# Patient Record
Sex: Female | Born: 1935 | Race: White | Hispanic: No | Smoking: Never smoker
Health system: Southern US, Community
[De-identification: ages and names within clinical notes are randomized; demographics above are authoritative.]

## PROBLEM LIST (undated history)

## (undated) DIAGNOSIS — M6740 Ganglion, unspecified site: Secondary | ICD-10-CM

## (undated) DIAGNOSIS — M1990 Unspecified osteoarthritis, unspecified site: Secondary | ICD-10-CM

## (undated) DIAGNOSIS — C801 Malignant (primary) neoplasm, unspecified: Secondary | ICD-10-CM

## (undated) DIAGNOSIS — I10 Essential (primary) hypertension: Secondary | ICD-10-CM

## (undated) HISTORY — PX: ABDOMINAL HYSTERECTOMY: SUR658

## (undated) HISTORY — DX: Unspecified osteoarthritis, unspecified site: M19.90

## (undated) HISTORY — PX: SKIN CANCER EXCISION: SHX779

## (undated) HISTORY — PX: BREAST BIOPSY: SHX20

## (undated) HISTORY — DX: Essential (primary) hypertension: I10

## (undated) HISTORY — PX: APPENDECTOMY: SHX54

## (undated) HISTORY — DX: Malignant (primary) neoplasm, unspecified: C80.1

---

## 2005-04-26 ENCOUNTER — Ambulatory Visit: Payer: Self-pay | Admitting: General Surgery

## 2005-09-12 ENCOUNTER — Ambulatory Visit: Payer: Self-pay

## 2006-04-29 ENCOUNTER — Ambulatory Visit: Payer: Self-pay | Admitting: General Surgery

## 2006-10-10 ENCOUNTER — Ambulatory Visit: Payer: Self-pay | Admitting: Gastroenterology

## 2007-03-26 ENCOUNTER — Ambulatory Visit: Payer: Self-pay | Admitting: Ophthalmology

## 2007-04-02 ENCOUNTER — Ambulatory Visit: Payer: Self-pay | Admitting: Ophthalmology

## 2007-05-02 ENCOUNTER — Ambulatory Visit: Payer: Self-pay | Admitting: General Surgery

## 2008-05-03 ENCOUNTER — Ambulatory Visit: Payer: Self-pay | Admitting: General Surgery

## 2009-05-05 ENCOUNTER — Ambulatory Visit: Payer: Self-pay | Admitting: General Surgery

## 2010-05-12 ENCOUNTER — Ambulatory Visit: Payer: Self-pay | Admitting: General Surgery

## 2011-05-23 ENCOUNTER — Ambulatory Visit: Payer: Self-pay | Admitting: Internal Medicine

## 2012-05-28 ENCOUNTER — Ambulatory Visit: Payer: Self-pay | Admitting: Internal Medicine

## 2013-06-02 ENCOUNTER — Ambulatory Visit: Payer: Self-pay | Admitting: Internal Medicine

## 2014-02-19 ENCOUNTER — Ambulatory Visit: Payer: Self-pay | Admitting: Internal Medicine

## 2014-09-29 ENCOUNTER — Ambulatory Visit: Payer: Self-pay | Admitting: Internal Medicine

## 2015-05-23 ENCOUNTER — Ambulatory Visit: Payer: Self-pay

## 2015-06-21 ENCOUNTER — Ambulatory Visit
Admission: RE | Admit: 2015-06-21 | Discharge: 2015-06-21 | Disposition: A | Discharge status: Home / Self Care | Payer: Medicare Other | Source: Ambulatory Visit | Attending: Physician Assistant | Admitting: Physician Assistant

## 2015-06-21 ENCOUNTER — Other Ambulatory Visit: Payer: Self-pay | Admitting: Physician Assistant

## 2015-06-21 DIAGNOSIS — Z90710 Acquired absence of both cervix and uterus: Secondary | ICD-10-CM | POA: Insufficient documentation

## 2015-06-21 DIAGNOSIS — R1031 Right lower quadrant pain: Secondary | ICD-10-CM | POA: Diagnosis not present

## 2015-06-21 DIAGNOSIS — K5900 Constipation, unspecified: Secondary | ICD-10-CM

## 2016-11-13 ENCOUNTER — Other Ambulatory Visit: Payer: Self-pay | Admitting: Internal Medicine

## 2016-11-13 DIAGNOSIS — R634 Abnormal weight loss: Secondary | ICD-10-CM

## 2016-11-13 DIAGNOSIS — R630 Anorexia: Secondary | ICD-10-CM

## 2016-11-15 ENCOUNTER — Ambulatory Visit
Admission: RE | Admit: 2016-11-15 | Discharge: 2016-11-15 | Disposition: A | Discharge status: Home / Self Care | Payer: Medicare Other | Source: Ambulatory Visit | Attending: Internal Medicine | Admitting: Internal Medicine

## 2016-11-15 DIAGNOSIS — R630 Anorexia: Secondary | ICD-10-CM | POA: Insufficient documentation

## 2016-11-15 DIAGNOSIS — Z90710 Acquired absence of both cervix and uterus: Secondary | ICD-10-CM | POA: Insufficient documentation

## 2016-11-15 DIAGNOSIS — R634 Abnormal weight loss: Secondary | ICD-10-CM | POA: Diagnosis present

## 2016-11-15 DIAGNOSIS — K5730 Diverticulosis of large intestine without perforation or abscess without bleeding: Secondary | ICD-10-CM | POA: Diagnosis not present

## 2016-12-11 ENCOUNTER — Other Ambulatory Visit: Payer: Self-pay | Admitting: Nurse Practitioner

## 2016-12-11 ENCOUNTER — Other Ambulatory Visit (HOSPITAL_COMMUNITY): Payer: Self-pay | Admitting: Nurse Practitioner

## 2016-12-11 DIAGNOSIS — R1013 Epigastric pain: Secondary | ICD-10-CM

## 2016-12-11 DIAGNOSIS — R634 Abnormal weight loss: Secondary | ICD-10-CM

## 2016-12-13 ENCOUNTER — Other Ambulatory Visit: Payer: Self-pay | Admitting: Nurse Practitioner

## 2016-12-13 DIAGNOSIS — R1013 Epigastric pain: Secondary | ICD-10-CM

## 2016-12-13 DIAGNOSIS — R634 Abnormal weight loss: Secondary | ICD-10-CM

## 2016-12-14 ENCOUNTER — Ambulatory Visit: Payer: Medicare Other

## 2016-12-27 ENCOUNTER — Other Ambulatory Visit: Payer: Medicare Other

## 2016-12-27 ENCOUNTER — Ambulatory Visit: Payer: Medicare Other

## 2018-10-28 ENCOUNTER — Ambulatory Visit (INDEPENDENT_AMBULATORY_CARE_PROVIDER_SITE_OTHER): Payer: Medicare HMO | Admitting: Orthopaedic Surgery

## 2018-10-28 ENCOUNTER — Ambulatory Visit (INDEPENDENT_AMBULATORY_CARE_PROVIDER_SITE_OTHER): Payer: Medicare HMO

## 2018-10-28 ENCOUNTER — Other Ambulatory Visit (INDEPENDENT_AMBULATORY_CARE_PROVIDER_SITE_OTHER): Payer: Self-pay | Admitting: Radiology

## 2018-10-28 ENCOUNTER — Encounter (INDEPENDENT_AMBULATORY_CARE_PROVIDER_SITE_OTHER): Payer: Self-pay | Admitting: Orthopaedic Surgery

## 2018-10-28 VITALS — BP 165/81 | HR 72 | Resp 18 | Ht 59.5 in | Wt 98.0 lb

## 2018-10-28 DIAGNOSIS — M25552 Pain in left hip: Secondary | ICD-10-CM

## 2018-10-28 DIAGNOSIS — M25561 Pain in right knee: Secondary | ICD-10-CM | POA: Diagnosis not present

## 2018-10-28 DIAGNOSIS — M25551 Pain in right hip: Secondary | ICD-10-CM | POA: Diagnosis not present

## 2018-10-28 DIAGNOSIS — M5442 Lumbago with sciatica, left side: Secondary | ICD-10-CM | POA: Diagnosis not present

## 2018-10-28 DIAGNOSIS — M5441 Lumbago with sciatica, right side: Secondary | ICD-10-CM

## 2018-10-28 DIAGNOSIS — G8929 Other chronic pain: Secondary | ICD-10-CM

## 2018-10-28 DIAGNOSIS — M545 Low back pain: Principal | ICD-10-CM

## 2018-10-28 NOTE — Progress Notes (Signed)
Office Visit Note   Patient: Mary Lara           Date of Birth: 1923/05/19           MRN: [id number] Visit Date: 10/28/2018              Requested by: Tracie Harrier, MD [address] [facility] [place], [place] [address] PCP: Tracie Harrier, MD   Assessment & Plan: Visit Diagnoses:  1. Bilateral hip pain   2. Chronic pain of right knee   3. Chronic bilateral low back pain with bilateral sciatica     Plan: Multiple potential etiologies for bilateral lower extremity pain.  There is mild osteoarthritis of her right hip.  Significant collapse of the left femoral head with secondary osteoarthritis causing  groin pain.  Having claudication that I suspect is related to her lumbar spine.  Long discussion with family over 45 minutes 50% in counseling regarding all of the above.  The obvious problem is the left hip.  I do believe that some of her pain is related to her back and will obtain an MRI scan.  She may be a candidate for epidural steroid injections.  might be a candidate for total hip replacement but I would like to explore other possible pain etiologies before suggesting that  Follow-Up Instructions: Return after MRI L-S spine.   Orders:  Orders Placed This Encounter  Procedures  . XR HIPS BILAT W OR W/O PELVIS 3-4 VIEWS  . XR Lumbar Spine 2-3 Views  . XR KNEE 3 VIEW RIGHT   No orders of the defined types were placed in this encounter.     Procedures: No procedures performed   Clinical Data: No additional findings.   Subjective: Chief Complaint  Patient presents with  . Left Hip - Pain  . Hip Pain    Total body pain, left hip pain x 2 years, worsening, no injury, no left hip surgery, Tylenol, IBU, Tramadol helps very little. difficulty walking, discuss hip replacement, bil knee swelling  Mary Lara is 84 years old accompanied by her son and daughter for evaluation of left hip lower extremity pain.  Mary Lara has been followed medically in  [facility] with evidence of progressive collapse of the left femoral head.  She has had several cortisone injections in her hip the last of which was of little if any benefit.  Walks with a walker.  She lives by herself and would like to remain independent.  She does have some back pain and bilateral lower extremity pain.  The longer she stands or walks the more discomfort she experiences in both of her thighs and legs. after sitting for period of time she feels better.  She does experience bilateral knee pain right greater than left.  She is been taking tramadol, ibuprofen and Tylenol for pain  HPI  Review of Systems  Constitutional: Positive for fatigue.  HENT: Negative for trouble swallowing.   Eyes: Positive for pain.  Respiratory: Positive for shortness of breath.   Cardiovascular: Positive for leg swelling.  Gastrointestinal: Negative for constipation.  Endocrine: Positive for cold intolerance.  Genitourinary: Negative for difficulty urinating.  Musculoskeletal: Positive for gait problem.  Skin: Negative for rash.  Allergic/Immunologic: Negative for food allergies.  Neurological: Positive for numbness.  Hematological: Does not bruise/bleed easily.  Psychiatric/Behavioral: Negative for sleep disturbance.     Objective: Vital Signs: BP (!) 165/81   Pulse 72   Resp 18   Ht 4' 11.5" (  1.511 m)   Wt 98 lb (44.5 kg)   BMI 19.46 kg/m   Physical Exam  Constitutional: She is oriented to person, place, and time. She appears well-developed and well-nourished.  HENT:  Mouth/Throat: Oropharynx is clear and moist.  Eyes: Pupils are equal, round, and reactive to light. EOM are normal.  Pulmonary/Chest: Effort normal.  Neurological: She is alert and oriented to person, place, and time.  Skin: Skin is warm and dry.  Psychiatric: She has a normal mood and affect. Her behavior is normal.    Ortho Exam awake alert and oriented x3.  Comfortable sitting.  Left leg is shorter than her right  based on collapse of the left femoral head.  She does have internal/external rotation of about 20 degrees with some pain on extreme.  Does have some mild loss of motion of her right hip with internal/external rotation but with minimal discomfort.  Increased valgus right knee with small amount of effusion.  Pain predominantly laterally but also has some patellofemoral and medial pain.  Minimal edema both of her ankles.  I did not feel pulses but the feet were warm good capillary refill.  No percussible tenderness of the lumbar spine.  Straight leg raise negative.  Specialty Comments:  No specialty comments available.  Imaging: Xr Hips Bilat W Or W/o Pelvis 3-4 Views  Result Date: 10/28/2018 AP the pelvis demonstrates some narrowing of the joint space right hip.  There are areas of subchondral sclerosis consistent with arthritis.  There is considerable collapse of the left femoral head consistent with avascular necrosis and secondary arthritis  Xr Knee 3 View Right  Result Date: 10/28/2018 Films of the right knee were obtained in 3 projections standing.  There is bone-on-bone in the lateral compartment with about 7 degrees of valgus.  No acute change.  Arthritic changes in all 3 compartments but predominantly laterally where there is bone-on-bone  Xr Lumbar Spine 2-3 Views  Result Date: 10/28/2018 The lumbar spine revealed considerable degenerative disc disease at L2-3, L3-4, L4-5 and L5-S1.  No listhesis.  Facet sclerosis is prominent age of the above levels.  Diffuse calcification of the abdominal aorta without obvious aneurysmal dilatation.  Right-sided degenerative scoliosis of about 7 to 8 degrees    PMFS History: There are no active problems to display for this patient.  Past Medical History:  Diagnosis Date  . Cancer ([place])   . Hypertension   . Osteoarthritis     History reviewed. No pertinent family history.  Past Surgical History:  Procedure Laterality Date  . ABDOMINAL  HYSTERECTOMY    . APPENDECTOMY    . BREAST BIOPSY    . SKIN CANCER EXCISION     Social History   Occupational History  . Not on file  Tobacco Use  . Smoking status: Never Smoker  . Smokeless tobacco: Never Used  Substance and Sexual Activity  . Alcohol use: Never    Frequency: Never  . Drug use: Never  . Sexual activity: Not Currently

## 2018-11-03 ENCOUNTER — Ambulatory Visit (INDEPENDENT_AMBULATORY_CARE_PROVIDER_SITE_OTHER): Payer: Self-pay | Admitting: Orthopaedic Surgery

## 2018-11-05 ENCOUNTER — Ambulatory Visit: Payer: Medicare Other

## 2018-11-09 ENCOUNTER — Ambulatory Visit: Admission: RE | Admit: 2018-11-09 | Payer: Medicare HMO | Source: Ambulatory Visit

## 2018-11-10 ENCOUNTER — Telehealth (INDEPENDENT_AMBULATORY_CARE_PROVIDER_SITE_OTHER): Payer: Self-pay | Admitting: Orthopaedic Surgery

## 2018-11-10 ENCOUNTER — Other Ambulatory Visit (INDEPENDENT_AMBULATORY_CARE_PROVIDER_SITE_OTHER): Payer: Self-pay | Admitting: Orthopaedic Surgery

## 2018-11-10 ENCOUNTER — Ambulatory Visit
Admission: RE | Admit: 2018-11-10 | Discharge: 2018-11-10 | Disposition: A | Discharge status: Home / Self Care | Payer: Medicare HMO | Source: Ambulatory Visit | Attending: Orthopaedic Surgery | Admitting: Orthopaedic Surgery

## 2018-11-10 DIAGNOSIS — M545 Low back pain, unspecified: Secondary | ICD-10-CM

## 2018-11-10 DIAGNOSIS — G8929 Other chronic pain: Secondary | ICD-10-CM

## 2018-11-10 NOTE — Telephone Encounter (Signed)
MRI worked in at [facility] at 11:30, patient aware.

## 2018-11-10 NOTE — Telephone Encounter (Signed)
Patient's daughter [name] called stating that her mom was scheduled for an MRI yesterday 11/09/18 which was cancelled due to the "MRI being down."  [name] is requesting that Dr. Durward Fortes send the referral "STAT" so that her mom can have her MRI today.  [name] requested a return call at [phone] ASAP.

## 2018-11-11 ENCOUNTER — Ambulatory Visit (INDEPENDENT_AMBULATORY_CARE_PROVIDER_SITE_OTHER): Payer: Medicare HMO | Admitting: Orthopaedic Surgery

## 2018-11-11 ENCOUNTER — Encounter (INDEPENDENT_AMBULATORY_CARE_PROVIDER_SITE_OTHER): Payer: Self-pay | Admitting: Orthopaedic Surgery

## 2018-11-11 ENCOUNTER — Other Ambulatory Visit: Payer: Medicare HMO

## 2018-11-11 VITALS — BP 143/79 | HR 75 | Ht 59.5 in | Wt 97.0 lb

## 2018-11-11 DIAGNOSIS — G8929 Other chronic pain: Secondary | ICD-10-CM

## 2018-11-11 DIAGNOSIS — M545 Low back pain, unspecified: Secondary | ICD-10-CM

## 2018-11-11 NOTE — Progress Notes (Signed)
Office Visit Note   Patient: Mary Lara           Date of Birth: [birth year]           MRN: [id number] Visit Date: 11/11/2018              Requested by: Tracie Harrier, MD [address] [facility] [place], [place] [address] PCP: Tracie Harrier, MD   Assessment & Plan: Visit Diagnoses:  1. Chronic midline low back pain, unspecified whether sciatica present     Plan: MRI of lumbar spine demonstrates bilateral lateral recess and foraminal narrowing left worse than right at L3-4 that could cause neural compression particularly on the left.  At L4-5 there was lateral recess stenosis right more than left that could cause right-sided neural compression.  Mary Lara does have pain in both lower extremities which in part may be related to her back.  I think epidural steroid injection would be helpful from a both diagnostic and therapeutic standpoint.  Long discussion with [name] and her son [name].  She certainly has evidence of arthritis of her right knee and significant arthritis of her left hip.  She might be a candidate for hip replacement would like to eliminate all the sources of pain before we proceed.  Discussed at length over approximately 30 minutes 50% of the time in counseling.  Follow-Up Instructions: Return in about 3 weeks (around 12/02/2018).   Orders:  Orders Placed This Encounter  Procedures  . Ambulatory referral to Physical Medicine Rehab   No orders of the defined types were placed in this encounter.     Procedures: No procedures performed   Clinical Data: No additional findings.   Subjective: Chief Complaint  Patient presents with  . Right Hip - Follow-up  . Left Hip - Follow-up  MRI scan as above.  HPI  Review of Systems   Objective: Vital Signs: BP (!) 143/79   Pulse 75   Ht 4' 11.5" (1.511 m)   Wt 97 lb (44 kg)   BMI 19.26 kg/m   Physical Exam  Constitutional: She is oriented to person, place, and time. She appears  well-developed and well-nourished.  HENT:  Mouth/Throat: Oropharynx is clear and moist.  Eyes: Pupils are equal, round, and reactive to light. EOM are normal.  Pulmonary/Chest: Effort normal.  Neurological: She is alert and oriented to person, place, and time.  Skin: Skin is warm and dry.  Psychiatric: She has a normal mood and affect. Her behavior is normal.    Ortho Exam hard of hearing.  Awake alert and oriented x3.  Thin.  No change in exam from that performed several weeks ago.  Straight leg raise negative.  Certainly has arthritic changes about the right knee with hypertrophic changes and lack of full extension.  Significant decreased motion of left hip related to the arthritis.  No distal edema.  +1 pulses and good sensibility  Specialty Comments:  No specialty comments available.  Imaging: No results found.   PMFS History: There are no active problems to display for this patient.  Past Medical History:  Diagnosis Date  . Cancer ([place])   . Hypertension   . Osteoarthritis     History reviewed. No pertinent family history.  Past Surgical History:  Procedure Laterality Date  . ABDOMINAL HYSTERECTOMY    . APPENDECTOMY    . BREAST BIOPSY    . SKIN CANCER EXCISION     Social History   Occupational History  .  Not on file  Tobacco Use  . Smoking status: Never Smoker  . Smokeless tobacco: Never Used  Substance and Sexual Activity  . Alcohol use: Never    Frequency: Never  . Drug use: Never  . Sexual activity: Not Currently     Garald Balding, MD   Note - This record has been created using [employer].  Chart creation errors have been sought, but may not always  have been located. Such creation errors do not reflect on  the standard of medical care. ep

## 2018-11-19 ENCOUNTER — Ambulatory Visit: Payer: Medicare Other

## 2018-12-06 ENCOUNTER — Other Ambulatory Visit: Payer: Self-pay

## 2018-12-06 ENCOUNTER — Encounter: Payer: Self-pay | Admitting: Emergency Medicine

## 2018-12-06 ENCOUNTER — Ambulatory Visit
Admission: EM | Admit: 2018-12-06 | Discharge: 2018-12-06 | Disposition: A | Discharge status: Home / Self Care | Payer: Medicare HMO | Attending: Family Medicine | Admitting: Family Medicine

## 2018-12-06 DIAGNOSIS — B349 Viral infection, unspecified: Secondary | ICD-10-CM | POA: Diagnosis not present

## 2018-12-06 LAB — URINALYSIS, COMPLETE (UACMP) WITH MICROSCOPIC
BILIRUBIN URINE: NEGATIVE
GLUCOSE, UA: NEGATIVE mg/dL
Ketones, ur: NEGATIVE mg/dL
Leukocytes, UA: NEGATIVE
NITRITE: NEGATIVE
PH: 6.5 (ref 5.0–8.0)
Protein, ur: NEGATIVE mg/dL
SPECIFIC GRAVITY, URINE: 1.01 (ref 1.005–1.030)

## 2018-12-06 LAB — RAPID INFLUENZA A&B ANTIGENS
Influenza A (ARMC): NEGATIVE
Influenza B (ARMC): NEGATIVE

## 2018-12-06 NOTE — Discharge Instructions (Signed)
Rest. Fluids.  Take care  Dr. Lacinda Axon

## 2018-12-06 NOTE — ED Provider Notes (Signed)
[facility]    CSN: [id number] Arrival date & time: 12/06/18  1229  History   Chief Complaint Chief Complaint  Patient presents with  . Fever  . Fatigue   HPI  84 year old female presents for evaluation of the above.  Patient states that she did not feel well yesterday.  She states that she "felt sick".  She states that she felt "bad all over".  When asked to further describe this she states that she felt weak.  She had one episode of emesis.  No abdominal pain.  Patient states that she feels improved today but still feels fatigued/weak.  Daughter is concerned about urinary tract infection.  She denies any urinary symptoms.  No abdominal pain.  She has joint pain secondary to osteoarthritis.  Per the daughter, she had a low-grade temperature yesterday of 100.3.  She gave Tylenol with improvement.  She is afebrile currently.  No other associated symptoms.  No other complaints.  History reviewed and updated as below.  Past Medical History:  Diagnosis Date  . Cancer ([place])   . Hypertension   . Osteoarthritis    Past Surgical History:  Procedure Laterality Date  . ABDOMINAL HYSTERECTOMY    . APPENDECTOMY    . BREAST BIOPSY    . SKIN CANCER EXCISION      OB History   No obstetric history on file.    Home Medications    Prior to Admission medications   Medication Sig Start Date End Date Taking? Authorizing Provider  acetaminophen (TYLENOL) 325 MG tablet Take by mouth.   Yes [provider]  aspirin EC 81 MG tablet Take by mouth.   Yes [provider]  b complex vitamins tablet Take by mouth.   Yes [provider]  Calcium Carbonate-Vitamin D 600-400 MG-UNIT tablet Take by mouth.   Yes [provider]  Cholecalciferol (VITAMIN D3) 25 MCG (1000 UT) CAPS Take by mouth.   Yes [provider]  docusate sodium (COLACE) 100 MG capsule Take by mouth. 03/13/18  Yes [provider]  furosemide (LASIX) 20 MG tablet  furosemide 20 mg tablet 07/26/18  Yes [provider]  ibuprofen (ADVIL,MOTRIN) 200 MG tablet Take by mouth.   Yes [provider]  metoprolol succinate (TOPROL-XL) 50 MG 24 hr tablet metoprolol succinate ER 50 mg tablet,extended release 24 hr 12/16/17  Yes [provider]  mirtazapine (REMERON) 7.5 MG tablet mirtazapine 7.5 mg tablet 07/08/18  Yes [provider]  Multiple Vitamins-Minerals (CERTAVITE SENIOR/ANTIOXIDANT) TABS Take by mouth.   Yes [provider]  Omega-3 Fatty Acids (FISH OIL OMEGA-3 PO) Fish Oil   Yes [provider]  Omega-3 Fatty Acids (FISH OIL) 1000 MG CAPS Take by mouth.   Yes [provider]  pantoprazole (PROTONIX) 40 MG tablet pantoprazole 40 mg tablet,delayed release 02/12/18  Yes [provider]  Polyethyl Glycol-Propyl Glycol (SYSTANE OP) Apply to eye.   Yes [provider]  simvastatin (ZOCOR) 20 MG tablet simvastatin 20 mg tablet 02/05/18  Yes [provider]  traMADol (ULTRAM) 50 MG tablet tramadol 50 mg tablet 01/07/18  Yes [provider]  VITAMIN D PO Vitamin D   Yes [provider]  Calcium Carbonate-Vitamin D3 (CALCIUM 600+D3) 600-400 MG-UNIT TABS calcium    [provider]  ranitidine (ZANTAC) 150 MG capsule Take by mouth. 04/24/18 04/24/19  [provider]   Social History Social History   Tobacco Use  . Smoking status: Never Smoker  .  Smokeless tobacco: Never Used  Substance Use Topics  . Alcohol use: Never    Frequency: Never  . Drug use: Never     Allergies   Iodine and Sulfa antibiotics   Review of Systems Review of Systems  Constitutional: Positive for fatigue and fever.  Gastrointestinal: Positive for vomiting.  Neurological: Positive for weakness.   Physical Exam Triage Vital Signs ED Triage Vitals  Enc Vitals Group     BP 12/06/18 1250 133/62     Pulse Rate 12/06/18 1250 87     Resp 12/06/18 1250 18     Temp  12/06/18 1250 97.7 F (36.5 C)     Temp Source 12/06/18 1250 Oral     SpO2 12/06/18 1250 99 %     Weight 12/06/18 1247 99 lb (44.9 kg)     Height 12/06/18 1247 5' (1.524 m)     Head Circumference --      Peak Flow --      Pain Score 12/06/18 1246 0     Pain Loc --      Pain Edu? --      Excl. in [place]? --    Updated Vital Signs BP 133/62 (BP Location: Right Arm)   Pulse 87   Temp 97.7 F (36.5 C) (Oral)   Resp 18   Ht 5' (1.524 m)   Wt 44.9 kg   SpO2 99%   BMI 19.33 kg/m   Visual Acuity Right Eye Distance:   Left Eye Distance:   Bilateral Distance:    Right Eye Near:   Left Eye Near:    Bilateral Near:     Physical Exam Vitals signs and nursing note reviewed.  Constitutional:      General: She is not in acute distress.    Appearance: Normal appearance.  HENT:     Head: Normocephalic and atraumatic.     Nose: Nose normal.     Mouth/Throat:     Mouth: Mucous membranes are moist.     Pharynx: Oropharynx is clear.  Eyes:     General: No scleral icterus.    Conjunctiva/sclera: Conjunctivae normal.  Cardiovascular:     Rate and Rhythm: Normal rate and regular rhythm.  Pulmonary:     Effort: Pulmonary effort is normal. No respiratory distress.     Breath sounds: No wheezing, rhonchi or rales.  Abdominal:     General: There is no distension.     Palpations: Abdomen is soft.     Tenderness: There is no abdominal tenderness.  Neurological:     Mental Status: She is alert.  Psychiatric:        Mood and Affect: Mood normal.        Behavior: Behavior normal.    UC Treatments / Results  Labs (all labs ordered are listed, but only abnormal results are displayed) Labs Reviewed  URINALYSIS, COMPLETE (UACMP) WITH MICROSCOPIC - Abnormal; Notable for the following components:      Result Value   Hgb urine dipstick TRACE (*)    Bacteria, UA RARE (*)    All other components within normal limits  RAPID INFLUENZA A&B ANTIGENS ([facility] ONLY)    EKG None  Radiology No  results found.  Procedures Procedures (including critical care time)  Medications Ordered in UC Medications - No data to display  Initial Impression / Assessment and Plan / UC Course  I have reviewed the triage vital signs and the nursing notes.  Pertinent labs & imaging results that were available  during my care of the patient were reviewed by me and considered in my medical decision making (see chart for details).    84 year old female presents with feeling poorly with one episode of emesis.  She is afebrile currently.  Urinalysis negative.  Influenza negative.  This is likely a viral illness.  She is well-appearing at this time.  Supportive care.  Final Clinical Impressions(s) / UC Diagnoses   Final diagnoses:  Viral illness     Discharge Instructions     Rest. Fluids.  Take care  Dr. Lacinda Axon    ED Prescriptions    None     Controlled Substance Prescriptions [place] Controlled Substance Registry consulted? Not Applicable   Coral Spikes, DO 12/06/18 1717

## 2018-12-06 NOTE — ED Triage Notes (Signed)
Patient c/o fever of 100.3 that started yesterday. Vomited x 1 yesterday. Daughter is requesting a urinalysis to rule out UTI. Patient denies urinary symptoms.

## 2018-12-19 ENCOUNTER — Telehealth (INDEPENDENT_AMBULATORY_CARE_PROVIDER_SITE_OTHER): Payer: Self-pay | Admitting: Orthopaedic Surgery

## 2018-12-19 NOTE — Telephone Encounter (Signed)
Daughter wants to speak with Dr. Durward Fortes directly about patients back injection she had, and how she is doing after injection.

## 2018-12-22 ENCOUNTER — Telehealth (INDEPENDENT_AMBULATORY_CARE_PROVIDER_SITE_OTHER): Payer: Self-pay | Admitting: Orthopaedic Surgery

## 2018-12-22 NOTE — Telephone Encounter (Signed)
FYI

## 2018-12-22 NOTE — Telephone Encounter (Signed)
Patient's daughter [name] left a voicemail stating she was returning Dr. Rudene Anda call.

## 2018-12-22 NOTE — Telephone Encounter (Signed)
Do you have a phone number for daughter?

## 2018-12-22 NOTE — Telephone Encounter (Signed)
Called son and daughter and left messages

## 2018-12-23 NOTE — Telephone Encounter (Signed)
   [name], daughter, returning your call [phone]

## 2018-12-23 NOTE — Telephone Encounter (Signed)
called

## 2019-02-02 ENCOUNTER — Telehealth (INDEPENDENT_AMBULATORY_CARE_PROVIDER_SITE_OTHER): Payer: Self-pay | Admitting: Orthopaedic Surgery

## 2019-02-02 NOTE — Telephone Encounter (Signed)
Patient's daughter [name] left a voicemail requesting a return call regarding her mother's situation with her pain and results of the injections that she had on the 10th at [facility] and what options we have.

## 2019-02-02 NOTE — Telephone Encounter (Signed)
Spoke with patient's daughter [name].  She said that her mother had two lower back injections at L3-4 and L4-5 on the 10th. She has not had any relief from them. Her daughter is concerned for her quality of life due to her back pain. She wants to speak with you about any other options at this point. She has stopped taking the Tramadol and is now taking hydrocodone BID, but they have not noticed any improvement with that either. Her daughter [name] gets off work at [employer] today and would like a call afterwards to discuss.  Thanks! Ph [phone]

## 2019-02-02 NOTE — Telephone Encounter (Signed)
Called..We had a prolonged discussion about these complex clinical issues and went over the various important aspects to consider. All questions were answered.

## 2019-02-18 ENCOUNTER — Encounter (INDEPENDENT_AMBULATORY_CARE_PROVIDER_SITE_OTHER): Payer: Self-pay | Admitting: Orthopaedic Surgery

## 2019-02-18 ENCOUNTER — Ambulatory Visit (INDEPENDENT_AMBULATORY_CARE_PROVIDER_SITE_OTHER): Payer: Medicare HMO | Admitting: Orthopaedic Surgery

## 2019-02-18 ENCOUNTER — Other Ambulatory Visit: Payer: Self-pay

## 2019-02-18 DIAGNOSIS — M1612 Unilateral primary osteoarthritis, left hip: Secondary | ICD-10-CM

## 2019-02-18 NOTE — Progress Notes (Signed)
Office Visit Note   Patient: Mary Lara           Date of Birth: 03/09/1923           MRN: [id number] Visit Date: 02/18/2019              Requested by: Tracie Harrier, MD [address] [facility] [place], [place] [address] PCP: Tracie Harrier, MD   Assessment & Plan: Visit Diagnoses:  1. Unilateral primary osteoarthritis, left hip     Plan: Given the severity of her pain combined with her clinical exam findings and how bad her left hip x-rays look, I do feel it is appropriate to proceed with a total hip arthroplasty through direct anterior approach.  I talked in great detail about the risk and benefits of the surgery.  I showed her and her family a hip model and explained in detail what the surgery involves.  We talked about the intraoperative and postoperative course and what to expect specially with her recovery.  All question concerns were answered and addressed.  We will work on getting this scheduled in the near future.  Follow-Up Instructions: Return for 2 weeks post-op.   Orders:  No orders of the defined types were placed in this encounter.  No orders of the defined types were placed in this encounter.     Procedures: No procedures performed   Clinical Data: No additional findings.   Subjective: Chief Complaint  Patient presents with  . Hip Pain    bilateral hip pain  The patient is a very pleasant 85 year old female who comes in today with her family as a recommendation and referral from my partner Dr. Durward Fortes to consider a direct anterior total hip arthroplasty on her left hip.  She has severe debilitating left hip pain and known and well-documented osteoarthritis with some osteonecrosis of the left femoral head.  At this point her pain is so significant that it is detrimentally affecting her mobility, her quality of life and her activities day living.  She does have some lumbar spine issues and known arthritis in both knees but is the hip  that is most debilitating to her.  The pain is now 10 out of 10.  She has tried failed all forms of conservative treatment.  Her family is with her and attest to this.  She is otherwise healthy in terms of stable medical conditions.  She has no active problems right now and does take medications never reviewed all of these.  At this point she and her family wants to consider total hip arthroplasty surgery given her significant fall risk at this point and given the fact that she is trying to stay as independent as possible but still having severe difficulty with pain and mobility.  HPI  Review of Systems She currently denies any headache, chest pain, shortness of breath, fever, chills, nausea, vomiting  Objective: Vital Signs: There were no vitals taken for this visit.  Physical Exam She is alert and orient x3 and in no acute distress Ortho Exam Examination of her left hip shows almost essentially no internal or external rotation and you can actually feel the crepitation of the bone from the bone rubbing on bone.  Is absolutely awful in terms of the mobility and pain that she is experiencing.  Her right hip moves fluidly. Specialty Comments:  No specialty comments available.  Imaging: No results found. X-rays independently reviewed of the pelvis and hips shows severe end-stage arthritis of  the left hip with likely osteonecrosis as well.  The femoral head is collapsing as well.  There is essentially no joint space remaining.  PMFS History: Patient Active Problem List   Diagnosis Date Noted  . Unilateral primary osteoarthritis, left hip 02/18/2019   Past Medical History:  Diagnosis Date  . Cancer ([place])   . Hypertension   . Osteoarthritis     History reviewed. No pertinent family history.  Past Surgical History:  Procedure Laterality Date  . ABDOMINAL HYSTERECTOMY    . APPENDECTOMY    . BREAST BIOPSY    . SKIN CANCER EXCISION     Social History   Occupational History  . Not on  file  Tobacco Use  . Smoking status: Never Smoker  . Smokeless tobacco: Never Used  Substance and Sexual Activity  . Alcohol use: Never    Frequency: Never  . Drug use: Never  . Sexual activity: Not Currently

## 2019-03-30 ENCOUNTER — Inpatient Hospital Stay (INDEPENDENT_AMBULATORY_CARE_PROVIDER_SITE_OTHER): Payer: Medicare HMO | Admitting: Orthopaedic Surgery

## 2019-06-01 ENCOUNTER — Telehealth: Payer: Self-pay

## 2019-06-01 NOTE — Telephone Encounter (Signed)
Daughter, Joice Lofts, would like to speak with you about her mother.  She needs surgery but they are not wanting to proceed if patient cannot have visitors.  She states [facility] is now allowing visitors.  Please call [name] to further discuss.  [phone]

## 2019-07-15 ENCOUNTER — Other Ambulatory Visit: Payer: Self-pay

## 2019-07-16 ENCOUNTER — Other Ambulatory Visit: Payer: Self-pay | Admitting: Physician Assistant

## 2019-07-27 NOTE — Patient Instructions (Addendum)
YOU NEED TO HAVE A COVID 19 TEST ON_______ @_______ , THIS TEST MUST BE DONE BEFORE SURGERY, COME  [place] , [id number]. ONCE YOUR COVID TEST IS COMPLETED, PLEASE BEGIN THE QUARANTINE INSTRUCTIONS AS OUTLINED IN YOUR HANDOUT.                Mary Lara    Your procedure is scheduled on: 07-31-2019  Report to [facility] Main  Entrance  Report to admitting at 940 AM   1 [place]. NO VISITORS ARE ALLOWED IN SHORT STAY OR RECOVERY ROOM.   Call this number if you have problems the morning of surgery [phone]    Remember: [place], NO CHEWING GUM CANDY OR MINTS.   NO SOLID FOOD AFTER MIDNIGHT THE NIGHT PRIOR TO SURGERY. NOTHING BY MOUTH EXCEPT CLEAR LIQUIDS UNTIL  910 AM . PLEASE FINISH ENSURE DRINK PER SURGEON ORDER  WHICH NEEDS TO BE COMPLETED AT 940 AM  .   CLEAR LIQUID DIET   Foods Allowed                                                                     Foods Excluded  Coffee and tea, regular and decaf                             liquids that you cannot  Plain Jell-O any favor except red or purple                                           see through such as: Fruit ices (not with fruit pulp)                                     milk, soups, orange juice  Iced Popsicles                                    All solid food Carbonated beverages, regular and diet                                    Cranberry, grape and apple juices Sports drinks like Gatorade Lightly seasoned clear broth or consume(fat free) Sugar, honey syrup  Sample Menu Breakfast                                Lunch                                     Supper Cranberry juice  Beef broth                            Chicken broth Jell-O                                     Grape juice                           Apple juice Coffee or tea                         Jell-O                                      Popsicle                                                Coffee or tea                        Coffee or tea  _____________________________________________________________________     Take these medicines the morning of surgery with A SIP OF WATER: mortazaoien (remeron), simvastatin (zocor), metoprolol succinate, eye drop, pantapraole (protonix)                               You may not have any metal on your body including hair pins and              piercings  Do not wear jewelry, make-up, lotions, powders or perfumes, deodorant             Do not wear nail polish.  Do not shave  48 hours prior to surgery.                Do not bring valuables to the hospital. [place].  Contacts, dentures or bridgework may not be worn into surgery.  Leave suitcase in the car. After surgery it may be brought to your room.      _____________________________________________________________________             [facility] - Preparing for Surgery Before surgery, you can play an important role.  Because skin is not sterile, your skin needs to be as free of germs as possible.  You can reduce the number of germs on your skin by washing with CHG (chlorahexidine gluconate) soap before surgery.  CHG is an antiseptic cleaner which kills germs and bonds with the skin to continue killing germs even after washing. Please DO NOT use if you have an allergy to CHG or antibacterial soaps.  If your skin becomes reddened/irritated stop using the CHG and inform your nurse when you arrive at Short Stay. Do not shave (including legs and underarms) for at least 48 hours prior to the first CHG shower.  You may shave your face/neck. Please follow these instructions carefully:  1.  Shower with CHG Soap the night before surgery and the  morning  of Surgery.  2.  If you choose to wash your hair, wash your hair first as usual with your   normal  shampoo.  3.  After you shampoo, rinse your hair and body thoroughly to remove the  shampoo.                           4.  Use CHG as you would any other liquid soap.  You can apply chg directly  to the skin and wash                       Gently with a scrungie or clean washcloth.  5.  Apply the CHG Soap to your body ONLY FROM THE NECK DOWN.   Do not use on face/ open                           Wound or open sores. Avoid contact with eyes, ears mouth and genitals (private parts).                       Wash face,  Genitals (private parts) with your normal soap.             6.  Wash thoroughly, paying special attention to the area where your surgery  will be performed.  7.  Thoroughly rinse your body with warm water from the neck down.  8.  DO NOT shower/wash with your normal soap after using and rinsing off  the CHG Soap.                9.  Pat yourself dry with a clean towel.            10.  Wear clean pajamas.            11.  Place clean sheets on your bed the night of your first shower and do not  sleep with pets. Day of Surgery : Do not apply any lotions/deodorants the morning of surgery.  Please wear clean clothes to the hospital/surgery center.  FAILURE TO FOLLOW THESE INSTRUCTIONS MAY RESULT IN THE CANCELLATION OF YOUR SURGERY PATIENT SIGNATURE_________________________________  NURSE SIGNATURE__________________________________  ________________________________________________________________________   Mary Lara  An incentive spirometer is a tool that can help keep your lungs clear and active. This tool measures how well you are filling your lungs with each breath. Taking long deep breaths may help reverse or decrease the chance of developing breathing (pulmonary) problems (especially infection) following:  A long period of time when you are unable to move or be active. BEFORE THE PROCEDURE   If the spirometer includes an indicator to show your best effort, your  nurse or respiratory therapist will set it to a desired goal.  If possible, sit up straight or lean slightly forward. Try not to slouch.  Hold the incentive spirometer in an upright position. INSTRUCTIONS FOR USE  1. Sit on the edge of your bed if possible, or sit up as far as you can in bed or on a chair. 2. Hold the incentive spirometer in an upright position. 3. Breathe out normally. 4. Place the mouthpiece in your mouth and seal your lips tightly around it. 5. Breathe in slowly and as deeply as possible, raising the piston or the ball toward the top of the column. 6. Hold your breath for 3-5 seconds or for as long as possible.  Allow the piston or ball to fall to the bottom of the column. 7. Remove the mouthpiece from your mouth and breathe out normally. 8. Rest for a few seconds and repeat Steps 1 through 7 at least 10 times every 1-2 hours when you are awake. Take your time and take a few normal breaths between deep breaths. 9. The spirometer may include an indicator to show your best effort. Use the indicator as a goal to work toward during each repetition. 10. After each set of 10 deep breaths, practice coughing to be sure your lungs are clear. If you have an incision (the cut made at the time of surgery), support your incision when coughing by placing a pillow or rolled up towels firmly against it. Once you are able to get out of bed, walk around indoors and cough well. You may stop using the incentive spirometer when instructed by your caregiver.  RISKS AND COMPLICATIONS  Take your time so you do not get dizzy or light-headed.  If you are in pain, you may need to take or ask for pain medication before doing incentive spirometry. It is harder to take a deep breath if you are having pain. AFTER USE  Rest and breathe slowly and easily.  It can be helpful to keep track of a log of your progress. Your caregiver can provide you with a simple table to help with this. If you are using the  spirometer at home, follow these instructions: [place] IF:   You are having difficultly using the spirometer.  You have trouble using the spirometer as often as instructed.  Your pain medication is not giving enough relief while using the spirometer.  You develop fever of 100.5 F (38.1 C) or higher. SEEK IMMEDIATE MEDICAL CARE IF:   You cough up bloody sputum that had not been present before.  You develop fever of 102 F (38.9 C) or greater.  You develop worsening pain at or near the incision site. MAKE SURE YOU:   Understand these instructions.  Will watch your condition.  Will get help right away if you are not doing well or get worse. Document Released: 04/08/2007 Document Revised: 02/18/2012 Document Reviewed: 06/09/2007 Exit[employer] Patient Information 2014 [place].   ________________________________________________________________________   Incentive Spirometer  An incentive spirometer is a tool that can help keep your lungs clear and active. This tool measures how well you are filling your lungs with each breath. Taking long deep breaths may help reverse or decrease the chance of developing breathing (pulmonary) problems (especially infection) following:  A long period of time when you are unable to move or be active. BEFORE THE PROCEDURE   If the spirometer includes an indicator to show your best effort, your nurse or respiratory therapist will set it to a desired goal.  If possible, sit up straight or lean slightly forward. Try not to slouch.  Hold the incentive spirometer in an upright position. INSTRUCTIONS FOR USE  11. Sit on the edge of your bed if possible, or sit up as far as you can in bed or on a chair. 12. Hold the incentive spirometer in an upright position. 13. Breathe out normally. 14. Place the mouthpiece in your mouth and seal your lips tightly around it. 15. Breathe in slowly and as deeply as possible, raising the piston or the  ball toward the top of the column. 16. Hold your breath for 3-5 seconds or for as long as possible. Allow the piston or ball  to fall to the bottom of the column. 17. Remove the mouthpiece from your mouth and breathe out normally. 18. Rest for a few seconds and repeat Steps 1 through 7 at least 10 times every 1-2 hours when you are awake. Take your time and take a few normal breaths between deep breaths. 19. The spirometer may include an indicator to show your best effort. Use the indicator as a goal to work toward during each repetition. 20. After each set of 10 deep breaths, practice coughing to be sure your lungs are clear. If you have an incision (the cut made at the time of surgery), support your incision when coughing by placing a pillow or rolled up towels firmly against it. Once you are able to get out of bed, walk around indoors and cough well. You may stop using the incentive spirometer when instructed by your caregiver.  RISKS AND COMPLICATIONS  Take your time so you do not get dizzy or light-headed.  If you are in pain, you may need to take or ask for pain medication before doing incentive spirometry. It is harder to take a deep breath if you are having pain. AFTER USE  Rest and breathe slowly and easily.  It can be helpful to keep track of a log of your progress. Your caregiver can provide you with a simple table to help with this. If you are using the spirometer at home, follow these instructions: [place] IF:   You are having difficultly using the spirometer.  You have trouble using the spirometer as often as instructed.  Your pain medication is not giving enough relief while using the spirometer.  You develop fever of 100.5 F (38.1 C) or higher. SEEK IMMEDIATE MEDICAL CARE IF:   You cough up bloody sputum that had not been present before.  You develop fever of 102 F (38.9 C) or greater.  You develop worsening pain at or near the incision site. MAKE SURE  YOU:   Understand these instructions.  Will watch your condition.  Will get help right away if you are not doing well or get worse. Document Released: 04/08/2007 Document Revised: 02/18/2012 Document Reviewed: 06/09/2007 [facility] Patient Information 2014 [place], [place].   ________________________________________________________________________

## 2019-07-28 ENCOUNTER — Other Ambulatory Visit (HOSPITAL_COMMUNITY)
Admission: RE | Admit: 2019-07-28 | Discharge: 2019-07-28 | Disposition: A | Discharge status: Home / Self Care | Payer: Medicare HMO | Source: Ambulatory Visit | Attending: Orthopaedic Surgery | Admitting: Orthopaedic Surgery

## 2019-07-28 ENCOUNTER — Other Ambulatory Visit: Payer: Self-pay

## 2019-07-28 ENCOUNTER — Encounter (HOSPITAL_COMMUNITY): Payer: Self-pay

## 2019-07-28 ENCOUNTER — Encounter (HOSPITAL_COMMUNITY)
Admission: RE | Admit: 2019-07-28 | Discharge: 2019-07-28 | Disposition: A | Discharge status: Home / Self Care | Payer: Medicare HMO | Source: Ambulatory Visit | Attending: Orthopaedic Surgery | Admitting: Orthopaedic Surgery

## 2019-07-28 DIAGNOSIS — Z01818 Encounter for other preprocedural examination: Secondary | ICD-10-CM | POA: Insufficient documentation

## 2019-07-28 DIAGNOSIS — Z20828 Contact with and (suspected) exposure to other viral communicable diseases: Secondary | ICD-10-CM | POA: Insufficient documentation

## 2019-07-28 HISTORY — DX: Ganglion, unspecified site: M67.40

## 2019-07-28 LAB — BASIC METABOLIC PANEL
Anion gap: 5 (ref 5–15)
BUN: 26 mg/dL — ABNORMAL HIGH (ref 8–23)
CO2: 31 mmol/L (ref 22–32)
Calcium: 9.8 mg/dL (ref 8.9–10.3)
Chloride: 103 mmol/L (ref 98–111)
Creatinine, Ser: 0.6 mg/dL (ref 0.44–1.00)
GFR calc Af Amer: >60 mL/min (ref >60)
GFR calc non Af Amer: >60 mL/min (ref >60)
Glucose, Bld: 102 mg/dL — ABNORMAL HIGH (ref 70–99)
Potassium: 4.9 mmol/L (ref 3.5–5.1)
Sodium: 139 mmol/L (ref 135–145)

## 2019-07-28 LAB — CBC
HCT: 38.2 % (ref 36.0–46.0)
Hemoglobin: 12.2 g/dL (ref 12.0–15.0)
MCH: 31.5 pg (ref 26.0–34.0)
MCHC: 31.9 g/dL (ref 30.0–36.0)
MCV: 98.7 fL (ref 80.0–100.0)
Platelets: 235 10*3/uL (ref 150–400)
RBC: 3.87 MIL/uL (ref 3.87–5.11)
RDW: 14.5 % (ref 11.5–15.5)
WBC: 7 10*3/uL (ref 4.0–10.5)
nRBC: 0 % (ref 0.0–0.2)

## 2019-07-28 LAB — SURGICAL PCR SCREEN
MRSA, PCR: NEGATIVE
Staphylococcus aureus: NEGATIVE

## 2019-07-28 LAB — SARS CORONAVIRUS 2 (TAT 6-24 HRS): SARS Coronavirus 2: NEGATIVE

## 2019-07-30 ENCOUNTER — Telehealth: Payer: Self-pay | Admitting: Orthopaedic Surgery

## 2019-07-30 NOTE — Telephone Encounter (Signed)
Patient's daughter [name] called asked if her brother [name]) can see his mother after her surgery tomorrow when she goes to recovery. [name] said they would go one at a time to see her.[name] said a new company ([employer]) is coming to her mother's home for home health care over night and will require another Covid-19 test for her mother before she discharge from the hospital.  The number to contact patient is [phone]

## 2019-07-30 NOTE — Telephone Encounter (Signed)
See below

## 2019-07-31 ENCOUNTER — Ambulatory Visit (HOSPITAL_COMMUNITY): Payer: Medicare HMO | Admitting: Anesthesiology

## 2019-07-31 ENCOUNTER — Ambulatory Visit (HOSPITAL_COMMUNITY): Payer: Medicare HMO | Admitting: Physician Assistant

## 2019-07-31 ENCOUNTER — Ambulatory Visit (HOSPITAL_COMMUNITY): Payer: Medicare HMO

## 2019-07-31 ENCOUNTER — Inpatient Hospital Stay (HOSPITAL_COMMUNITY)
Admission: AD | Admit: 2019-07-31 | Discharge: 2019-08-03 | DRG: 470 | Disposition: A | Discharge status: Home Health | Payer: Medicare HMO | Attending: Orthopaedic Surgery | Admitting: Orthopaedic Surgery

## 2019-07-31 ENCOUNTER — Encounter (HOSPITAL_COMMUNITY)
Admission: AD | Disposition: A | Discharge status: Home Health | Payer: Self-pay | Source: Home / Self Care | Attending: Orthopaedic Surgery

## 2019-07-31 ENCOUNTER — Other Ambulatory Visit: Payer: Self-pay

## 2019-07-31 ENCOUNTER — Encounter (HOSPITAL_COMMUNITY): Payer: Self-pay | Admitting: *Deleted

## 2019-07-31 DIAGNOSIS — Z882 Allergy status to sulfonamides status: Secondary | ICD-10-CM

## 2019-07-31 DIAGNOSIS — Z79899 Other long term (current) drug therapy: Secondary | ICD-10-CM

## 2019-07-31 DIAGNOSIS — Z888 Allergy status to other drugs, medicaments and biological substances status: Secondary | ICD-10-CM

## 2019-07-31 DIAGNOSIS — R54 Age-related physical debility: Secondary | ICD-10-CM | POA: Diagnosis present

## 2019-07-31 DIAGNOSIS — Z20828 Contact with and (suspected) exposure to other viral communicable diseases: Secondary | ICD-10-CM | POA: Diagnosis present

## 2019-07-31 DIAGNOSIS — M1612 Unilateral primary osteoarthritis, left hip: Secondary | ICD-10-CM | POA: Diagnosis not present

## 2019-07-31 DIAGNOSIS — Z96642 Presence of left artificial hip joint: Secondary | ICD-10-CM

## 2019-07-31 DIAGNOSIS — N952 Postmenopausal atrophic vaginitis: Secondary | ICD-10-CM | POA: Diagnosis present

## 2019-07-31 DIAGNOSIS — N3592 Unspecified urethral stricture, female: Secondary | ICD-10-CM | POA: Diagnosis present

## 2019-07-31 DIAGNOSIS — D62 Acute posthemorrhagic anemia: Secondary | ICD-10-CM | POA: Diagnosis not present

## 2019-07-31 DIAGNOSIS — Z85828 Personal history of other malignant neoplasm of skin: Secondary | ICD-10-CM

## 2019-07-31 DIAGNOSIS — I10 Essential (primary) hypertension: Secondary | ICD-10-CM | POA: Diagnosis present

## 2019-07-31 DIAGNOSIS — M879 Osteonecrosis, unspecified: Secondary | ICD-10-CM | POA: Diagnosis present

## 2019-07-31 DIAGNOSIS — Z90710 Acquired absence of both cervix and uterus: Secondary | ICD-10-CM

## 2019-07-31 DIAGNOSIS — Z419 Encounter for procedure for purposes other than remedying health state, unspecified: Secondary | ICD-10-CM

## 2019-07-31 DIAGNOSIS — Z791 Long term (current) use of non-steroidal anti-inflammatories (NSAID): Secondary | ICD-10-CM

## 2019-07-31 DIAGNOSIS — Z79891 Long term (current) use of opiate analgesic: Secondary | ICD-10-CM

## 2019-07-31 DIAGNOSIS — Z7982 Long term (current) use of aspirin: Secondary | ICD-10-CM

## 2019-07-31 HISTORY — PX: TOTAL HIP ARTHROPLASTY: SHX124

## 2019-07-31 SURGERY — ARTHROPLASTY, HIP, TOTAL, ANTERIOR APPROACH
Anesthesia: Spinal | Site: Hip | Laterality: Left

## 2019-07-31 MED ORDER — MIRTAZAPINE 7.5 MG PO TABS
3.7500 mg | ORAL_TABLET | Freq: Every day | ORAL | Status: DC
  Administered 2019-07-31 – 2019-08-02 (×3): 3.75 mg via ORAL
  Filled 2019-07-31 (×4): qty 1

## 2019-07-31 MED ORDER — VITAMIN D 25 MCG (1000 UNIT) PO TABS
500.0000 [IU] | ORAL_TABLET | Freq: Two times a day (BID) | ORAL | Status: DC
  Administered 2019-08-01 – 2019-08-03 (×5): 500 [IU] via ORAL
  Filled 2019-07-31 (×6): qty 1

## 2019-07-31 MED ORDER — OXYCODONE HCL 5 MG PO TABS: 5.0000 mg | ORAL_TABLET | Freq: Once | ORAL | Status: DC | PRN

## 2019-07-31 MED ORDER — ASPIRIN 81 MG PO CHEW
81.0000 mg | CHEWABLE_TABLET | Freq: Two times a day (BID) | ORAL | Status: DC
  Administered 2019-07-31 – 2019-08-03 (×6): 81 mg via ORAL
  Filled 2019-07-31 (×6): qty 1

## 2019-07-31 MED ORDER — METHOCARBAMOL 1000 MG/10ML IJ SOLN
500.0000 mg | Freq: Four times a day (QID) | INTRAVENOUS | Status: DC | PRN
  Filled 2019-07-31: qty 5

## 2019-07-31 MED ORDER — MORPHINE SULFATE (PF) 2 MG/ML IV SOLN: 0.5000 mg | INTRAVENOUS | Status: DC | PRN

## 2019-07-31 MED ORDER — SODIUM CHLORIDE 0.9 % IR SOLN
Status: DC | PRN
  Administered 2019-07-31: 1000 mL

## 2019-07-31 MED ORDER — PROPOFOL 500 MG/50ML IV EMUL
INTRAVENOUS | Status: DC | PRN
  Administered 2019-07-31: 50 ug/kg/min via INTRAVENOUS

## 2019-07-31 MED ORDER — B COMPLEX PO TABS: 1.0000 | ORAL_TABLET | Freq: Every day | ORAL | Status: DC

## 2019-07-31 MED ORDER — EPHEDRINE SULFATE 50 MG/ML IJ SOLN
INTRAMUSCULAR | Status: DC | PRN
  Administered 2019-07-31 (×5): 5 mg via INTRAVENOUS

## 2019-07-31 MED ORDER — CLINDAMYCIN PHOSPHATE 600 MG/50ML IV SOLN
600.0000 mg | Freq: Four times a day (QID) | INTRAVENOUS | Status: AC
  Administered 2019-07-31 – 2019-08-01 (×2): 600 mg via INTRAVENOUS
  Filled 2019-07-31 (×2): qty 50

## 2019-07-31 MED ORDER — METOPROLOL SUCCINATE ER 50 MG PO TB24
50.0000 mg | ORAL_TABLET | Freq: Every day | ORAL | Status: DC
  Administered 2019-08-01 – 2019-08-03 (×3): 50 mg via ORAL
  Filled 2019-07-31 (×3): qty 1

## 2019-07-31 MED ORDER — ALUM & MAG HYDROXIDE-SIMETH 200-200-20 MG/5ML PO SUSP: 30.0000 mL | ORAL | Status: DC | PRN

## 2019-07-31 MED ORDER — 0.9 % SODIUM CHLORIDE (POUR BTL) OPTIME
TOPICAL | Status: DC | PRN
  Administered 2019-07-31: 1000 mL

## 2019-07-31 MED ORDER — POLYETHYL GLYCOL-PROPYL GLYCOL 0.4-0.3 % OP GEL: Freq: Three times a day (TID) | OPHTHALMIC | Status: DC

## 2019-07-31 MED ORDER — HYDROCODONE-ACETAMINOPHEN 5-325 MG PO TABS
1.0000 | ORAL_TABLET | ORAL | Status: DC | PRN
  Administered 2019-07-31 – 2019-08-02 (×4): 1 via ORAL
  Filled 2019-07-31 (×4): qty 1

## 2019-07-31 MED ORDER — DIPHENHYDRAMINE HCL 12.5 MG/5ML PO ELIX: 12.5000 mg | ORAL_SOLUTION | ORAL | Status: DC | PRN

## 2019-07-31 MED ORDER — BUPIVACAINE HCL (PF) 0.75 % IJ SOLN
INTRAMUSCULAR | Status: DC | PRN
  Administered 2019-07-31: 1.8 mL via INTRATHECAL

## 2019-07-31 MED ORDER — CLINDAMYCIN PHOSPHATE 900 MG/50ML IV SOLN
900.0000 mg | INTRAVENOUS | Status: AC
  Administered 2019-07-31: 900 mg via INTRAVENOUS
  Filled 2019-07-31: qty 50

## 2019-07-31 MED ORDER — HYDROMORPHONE HCL 1 MG/ML IJ SOLN: 0.2500 mg | INTRAMUSCULAR | Status: DC | PRN

## 2019-07-31 MED ORDER — ADULT MULTIVITAMIN W/MINERALS CH
1.0000 | ORAL_TABLET | Freq: Every day | ORAL | Status: DC
  Administered 2019-08-01 – 2019-08-03 (×3): 1 via ORAL
  Filled 2019-07-31 (×3): qty 1

## 2019-07-31 MED ORDER — CALCIUM CITRATE 950 (200 CA) MG PO TABS
200.0000 mg | ORAL_TABLET | Freq: Two times a day (BID) | ORAL | Status: DC
  Administered 2019-08-02: 200 mg via ORAL
  Filled 2019-07-31 (×7): qty 1

## 2019-07-31 MED ORDER — CHLORHEXIDINE GLUCONATE 4 % EX LIQD: 60.0000 mL | Freq: Once | CUTANEOUS | Status: DC

## 2019-07-31 MED ORDER — GABAPENTIN 100 MG PO CAPS
100.0000 mg | ORAL_CAPSULE | Freq: Three times a day (TID) | ORAL | Status: DC
  Administered 2019-07-31 – 2019-08-03 (×8): 100 mg via ORAL
  Filled 2019-07-31 (×10): qty 1

## 2019-07-31 MED ORDER — TRANEXAMIC ACID-NACL 1000-0.7 MG/100ML-% IV SOLN
1000.0000 mg | INTRAVENOUS | Status: AC
  Administered 2019-07-31: 1000 mg via INTRAVENOUS
  Filled 2019-07-31: qty 100

## 2019-07-31 MED ORDER — POLYVINYL ALCOHOL 1.4 % OP SOLN
1.0000 [drp] | Freq: Three times a day (TID) | OPHTHALMIC | Status: DC
  Administered 2019-07-31 – 2019-08-03 (×8): 1 [drp] via OPHTHALMIC
  Filled 2019-07-31: qty 15

## 2019-07-31 MED ORDER — PHENYLEPHRINE HCL (PRESSORS) 10 MG/ML IV SOLN
INTRAVENOUS | Status: DC | PRN
  Administered 2019-07-31 (×7): 80 ug via INTRAVENOUS

## 2019-07-31 MED ORDER — LIP MEDEX EX OINT
TOPICAL_OINTMENT | CUTANEOUS | Status: AC
  Filled 2019-07-31: qty 7

## 2019-07-31 MED ORDER — CERTAVITE SENIOR/ANTIOXIDANT PO TABS: 1.0000 | ORAL_TABLET | Freq: Every day | ORAL | Status: DC

## 2019-07-31 MED ORDER — FUROSEMIDE 20 MG PO TABS
20.0000 mg | ORAL_TABLET | ORAL | Status: DC
  Administered 2019-08-01: 20 mg via ORAL
  Filled 2019-07-31 (×2): qty 1

## 2019-07-31 MED ORDER — PHENOL 1.4 % MT LIQD
1.0000 | OROMUCOSAL | Status: DC | PRN
  Filled 2019-07-31: qty 177

## 2019-07-31 MED ORDER — PROPOFOL 10 MG/ML IV BOLUS
INTRAVENOUS | Status: AC
  Filled 2019-07-31: qty 40

## 2019-07-31 MED ORDER — SIMVASTATIN 20 MG PO TABS
20.0000 mg | ORAL_TABLET | Freq: Every day | ORAL | Status: DC
  Administered 2019-07-31 – 2019-08-02 (×3): 20 mg via ORAL
  Filled 2019-07-31 (×3): qty 1

## 2019-07-31 MED ORDER — ACETAMINOPHEN 325 MG PO TABS
325.0000 mg | ORAL_TABLET | Freq: Four times a day (QID) | ORAL | Status: DC | PRN
  Administered 2019-08-01 – 2019-08-02 (×2): 650 mg via ORAL
  Filled 2019-07-31 (×2): qty 2

## 2019-07-31 MED ORDER — MENTHOL 3 MG MT LOZG: 1.0000 | LOZENGE | OROMUCOSAL | Status: DC | PRN

## 2019-07-31 MED ORDER — EPHEDRINE 5 MG/ML INJ
INTRAVENOUS | Status: AC
  Filled 2019-07-31: qty 20

## 2019-07-31 MED ORDER — PROPOFOL 500 MG/50ML IV EMUL
INTRAVENOUS | Status: DC | PRN
  Administered 2019-07-31: 20 mg via INTRAVENOUS

## 2019-07-31 MED ORDER — PROMETHAZINE HCL 25 MG/ML IJ SOLN: 6.2500 mg | INTRAMUSCULAR | Status: DC | PRN

## 2019-07-31 MED ORDER — FENTANYL CITRATE (PF) 100 MCG/2ML IJ SOLN
INTRAMUSCULAR | Status: DC | PRN
  Administered 2019-07-31: 50 ug via INTRAVENOUS

## 2019-07-31 MED ORDER — VITAMIN D 25 MCG (1000 UNIT) PO TABS
1000.0000 [IU] | ORAL_TABLET | Freq: Every day | ORAL | Status: DC
  Administered 2019-08-01 – 2019-08-03 (×3): 1000 [IU] via ORAL
  Filled 2019-07-31 (×3): qty 1

## 2019-07-31 MED ORDER — CALCIUM CITRATE-VITAMIN D 315-200 MG-UNIT PO TABS: 1.0000 | ORAL_TABLET | Freq: Two times a day (BID) | ORAL | Status: DC

## 2019-07-31 MED ORDER — STERILE WATER FOR IRRIGATION IR SOLN
Status: DC | PRN
  Administered 2019-07-31: 2000 mL

## 2019-07-31 MED ORDER — HYDROCODONE-ACETAMINOPHEN 7.5-325 MG PO TABS: 1.0000 | ORAL_TABLET | ORAL | Status: DC | PRN

## 2019-07-31 MED ORDER — B COMPLEX-C PO TABS
1.0000 | ORAL_TABLET | Freq: Every day | ORAL | Status: DC
  Administered 2019-08-01 – 2019-08-03 (×3): 1 via ORAL
  Filled 2019-07-31 (×3): qty 1

## 2019-07-31 MED ORDER — ONDANSETRON HCL 4 MG/2ML IJ SOLN: 4.0000 mg | Freq: Four times a day (QID) | INTRAMUSCULAR | Status: DC | PRN

## 2019-07-31 MED ORDER — ONDANSETRON HCL 4 MG PO TABS: 4.0000 mg | ORAL_TABLET | Freq: Four times a day (QID) | ORAL | Status: DC | PRN

## 2019-07-31 MED ORDER — METOCLOPRAMIDE HCL 5 MG PO TABS: 5.0000 mg | ORAL_TABLET | Freq: Three times a day (TID) | ORAL | Status: DC | PRN

## 2019-07-31 MED ORDER — OXYCODONE HCL 5 MG/5ML PO SOLN: 5.0000 mg | Freq: Once | ORAL | Status: DC | PRN

## 2019-07-31 MED ORDER — LACTATED RINGERS IV SOLN
INTRAVENOUS | Status: DC
  Administered 2019-07-31 (×2): via INTRAVENOUS

## 2019-07-31 MED ORDER — SODIUM CHLORIDE 0.9 % IV SOLN
INTRAVENOUS | Status: DC
  Administered 2019-07-31: 19:00:00 via INTRAVENOUS

## 2019-07-31 MED ORDER — METOCLOPRAMIDE HCL 5 MG/ML IJ SOLN: 5.0000 mg | Freq: Three times a day (TID) | INTRAMUSCULAR | Status: DC | PRN

## 2019-07-31 MED ORDER — PANTOPRAZOLE SODIUM 40 MG PO TBEC
40.0000 mg | DELAYED_RELEASE_TABLET | Freq: Every day | ORAL | Status: DC
  Administered 2019-08-01 – 2019-08-03 (×3): 40 mg via ORAL
  Filled 2019-07-31 (×3): qty 1

## 2019-07-31 MED ORDER — DOCUSATE SODIUM 100 MG PO CAPS
100.0000 mg | ORAL_CAPSULE | Freq: Two times a day (BID) | ORAL | Status: DC
  Administered 2019-07-31 – 2019-08-02 (×5): 100 mg via ORAL
  Filled 2019-07-31 (×6): qty 1

## 2019-07-31 MED ORDER — FENTANYL CITRATE (PF) 100 MCG/2ML IJ SOLN
INTRAMUSCULAR | Status: AC
  Filled 2019-07-31: qty 2

## 2019-07-31 MED ORDER — VITAMIN D3 25 MCG (1000 UT) PO CAPS: 1000.0000 [IU] | ORAL_CAPSULE | Freq: Every day | ORAL | Status: DC

## 2019-07-31 MED ORDER — PHENYLEPHRINE 40 MCG/ML (10ML) SYRINGE FOR IV PUSH (FOR BLOOD PRESSURE SUPPORT)
PREFILLED_SYRINGE | INTRAVENOUS | Status: AC
  Filled 2019-07-31: qty 20

## 2019-07-31 MED ORDER — METHOCARBAMOL 500 MG PO TABS
500.0000 mg | ORAL_TABLET | Freq: Four times a day (QID) | ORAL | Status: DC | PRN
  Administered 2019-07-31 – 2019-08-01 (×3): 500 mg via ORAL
  Filled 2019-07-31 (×3): qty 1

## 2019-07-31 SURGICAL SUPPLY — 47 items
BAG URINE DRAINAGE (UROLOGICAL SUPPLIES) ×3 IMPLANT
BAG ZIPLOCK 12X15 (MISCELLANEOUS) IMPLANT
BENZOIN TINCTURE PRP APPL 2/3 (GAUZE/BANDAGES/DRESSINGS) IMPLANT
BLADE SAW SGTL 18X1.27X75 (BLADE) ×2 IMPLANT
BLADE SAW SGTL 18X1.27X75MM (BLADE) ×1
BLADE SURG SZ10 CARB STEEL (BLADE) ×6 IMPLANT
CATH COUDE 5CC RIBBED (CATHETERS) ×1 IMPLANT
CATH RIBBED COUDE 5CC (CATHETERS) ×2
CLOSURE WOUND 1/2 X4 (GAUZE/BANDAGES/DRESSINGS)
COVER PERINEAL POST (MISCELLANEOUS) ×3 IMPLANT
COVER SURGICAL LIGHT HANDLE (MISCELLANEOUS) ×3 IMPLANT
COVER WAND RF STERILE (DRAPES) IMPLANT
CUP ACET PINNACLE SECTR 48MM (Joint) ×1 IMPLANT
DRAPE STERI IOBAN 125X83 (DRAPES) ×3 IMPLANT
DRAPE U-SHAPE 47X51 STRL (DRAPES) ×6 IMPLANT
DRSG AQUACEL AG ADV 3.5X10 (GAUZE/BANDAGES/DRESSINGS) ×3 IMPLANT
DURAPREP 26ML APPLICATOR (WOUND CARE) ×3 IMPLANT
ELECT REM PT RETURN 15FT ADLT (MISCELLANEOUS) ×3 IMPLANT
GAUZE XEROFORM 1X8 LF (GAUZE/BANDAGES/DRESSINGS) ×3 IMPLANT
GLOVE BIO SURGEON STRL SZ7.5 (GLOVE) ×3 IMPLANT
GLOVE BIOGEL PI IND STRL 8 (GLOVE) ×2 IMPLANT
GLOVE BIOGEL PI INDICATOR 8 (GLOVE) ×4
GLOVE ECLIPSE 8.0 STRL XLNG CF (GLOVE) ×3 IMPLANT
GOWN STRL REUS W/TWL XL LVL3 (GOWN DISPOSABLE) ×6 IMPLANT
HANDPIECE INTERPULSE COAX TIP (DISPOSABLE) ×2
HEAD FEM STD 32X+1 STRL (Hips) ×3 IMPLANT
HOLDER FOLEY CATH W/STRAP (MISCELLANEOUS) ×3 IMPLANT
KIT TURNOVER KIT A (KITS) IMPLANT
LINER ACET 32X48 (Liner) ×3 IMPLANT
PACK TOTAL JOINT (CUSTOM PROCEDURE TRAY) ×3 IMPLANT
PINNSECTOR W/GRIP ACE CUP 48MM (Joint) ×3 IMPLANT
SCREW 6.5MMX25MM (Screw) ×3 IMPLANT
SET HNDPC FAN SPRY TIP SCT (DISPOSABLE) ×1 IMPLANT
STAPLER VISISTAT 35W (STAPLE) IMPLANT
STEM CORAIL KA10 (Stem) ×3 IMPLANT
STRIP CLOSURE SKIN 1/2X4 (GAUZE/BANDAGES/DRESSINGS) IMPLANT
SUT ETHIBOND NAB CT1 #1 30IN (SUTURE) ×3 IMPLANT
SUT ETHILON 2 0 PS N (SUTURE) IMPLANT
SUT MNCRL AB 4-0 PS2 18 (SUTURE) IMPLANT
SUT VIC AB 0 CT1 36 (SUTURE) ×3 IMPLANT
SUT VIC AB 1 CT1 36 (SUTURE) ×3 IMPLANT
SUT VIC AB 2-0 CT1 27 (SUTURE) ×4
SUT VIC AB 2-0 CT1 TAPERPNT 27 (SUTURE) ×2 IMPLANT
SYR 10ML LL (SYRINGE) ×3 IMPLANT
TRAY FOLEY MTR SLVR 14FR STAT (SET/KITS/TRAYS/PACK) ×3 IMPLANT
TRAY FOLEY MTR SLVR 16FR STAT (SET/KITS/TRAYS/PACK) ×3 IMPLANT
YANKAUER SUCT BULB TIP 10FT TU (MISCELLANEOUS) ×3 IMPLANT

## 2019-07-31 NOTE — Consult Note (Signed)
The Urology Consult   Physician requesting consult: Dr Kathrynn Speed  Reason for consult:  Difficult catheter placement  History of Present Illness: Mary Lara is a 85 y.o. female  Undergoing left total hip arthroplasty.  Prior to the procedure, after anesthetic administration, catheter placement was attempted by the nursing staff and was impossible.  Urologic consultation is requested.    Due to the patient's anesthetic state, prior history regarding her urologic  Condition was impossible.    Past Medical History:  Diagnosis Date  . Cancer ([place])   . Ganglion cyst    R wrist  . Hypertension   . Osteoarthritis     Past Surgical History:  Procedure Laterality Date  . ABDOMINAL HYSTERECTOMY    . APPENDECTOMY    . BREAST BIOPSY    . SKIN CANCER EXCISION       Current Hospital Medications: Scheduled Meds: . aspirin  81 mg Oral BID  . [START ON 08/01/2019] B-complex with vitamin C  1 tablet Oral Daily  . calcium citrate  200 mg of elemental calcium Oral BID   Or  . cholecalciferol  500 Units Oral BID  . [START ON 08/01/2019] cholecalciferol  1,000 Units Oral Daily  . docusate sodium  100 mg Oral BID  . [START ON 08/01/2019] furosemide  20 mg Oral QODAY  . [START ON 08/01/2019] metoprolol succinate  50 mg Oral Daily  . mirtazapine  3.75 mg Oral QHS  . [START ON 08/01/2019] multivitamin with minerals  1 tablet Oral Daily  . [START ON 08/01/2019] pantoprazole  40 mg Oral QAC breakfast  . polyvinyl alcohol  1 drop Both Eyes TID  . simvastatin  20 mg Oral QHS   Continuous Infusions: . sodium chloride    . clindamycin (CLEOCIN) IV    . methocarbamol (ROBAXIN) IV     PRN Meds:.[START ON 08/01/2019] acetaminophen, alum & mag hydroxide-simeth, diphenhydrAMINE, HYDROcodone-acetaminophen, HYDROcodone-acetaminophen, menthol-cetylpyridinium **OR** phenol, methocarbamol **OR** methocarbamol (ROBAXIN) IV, metoCLOPramide **OR** metoCLOPramide (REGLAN) injection, morphine injection, ondansetron  **OR** ondansetron (ZOFRAN) IV  Allergies:  Allergies  Allergen Reactions  . Iodine Rash  . Sulfa Antibiotics     Headaches, but not sure     History reviewed. No pertinent family history.  Social History:  reports that she has never smoked. She has never used smokeless tobacco. She reports that she does not drink alcohol or use drugs.  ROS: Level V caveat  Physical Exam:  Vital signs in last 24 hours: Temp:  [95 F (35 C)-97.5 F (36.4 C)] 97.3 F (36.3 C) (08/21 1615) Pulse Rate:  [68-79] 71 (08/21 1615) Resp:  [12-19] 13 (08/21 1615) BP: (94-141)/(45-61) 131/60 (08/21 1615) SpO2:  [100 %] 100 % (08/21 1615) Weight:  [45.1 kg] 45.1 kg (08/21 0911)    The patient was under anesthesia.  External genitalia revealed moderate vaginal atrophy.  Urethral meatus was somewhat stenotic.  There was no cystocele noted.  Laboratory Data:  No results for input(s): WBC, HGB, HCT, PLT in the last 72 hours.  No results for input(s): NA, K, CL, GLUCOSE, BUN, CALCIUM, CREATININE in the last 72 hours.  Invalid input(s): CO3   No results found for this or any previous visit (from the past 24 hour(s)). Recent Results (from the past 240 hour(s))  Surgical pcr screen     Status: None   Collection Time: 07/28/19  2:47 PM   Specimen: Nasal Mucosa; Nasal Swab  Result Value Ref Range Status   MRSA, PCR NEGATIVE NEGATIVE Final  Staphylococcus aureus NEGATIVE NEGATIVE Final    Comment: (NOTE) The Xpert SA Assay (FDA approved for NASAL specimens in patients 45 years of age and older), is one component of a comprehensive surveillance program. It is not intended to diagnose infection nor to guide or monitor treatment. Performed at [facility], [place] [address] [place], [place] [address]   SARS CORONAVIRUS 2 Nasal Swab Aptima Multi Swab     Status: None   Collection Time: 07/28/19  3:43 PM   Specimen: Aptima Multi Swab; Nasal Swab  Result Value Ref Range Status   SARS  Coronavirus 2 NEGATIVE NEGATIVE Final    Comment: (NOTE) SARS-CoV-2 target nucleic acids are NOT DETECTED. The SARS-CoV-2 RNA is generally detectable in upper and lower respiratory specimens during the acute phase of infection. Negative results do not preclude SARS-CoV-2 infection, do not rule out co-infections with other pathogens, and should not be used as the sole basis for treatment or other patient management decisions. Negative results must be combined with clinical observations, patient history, and epidemiological information. The expected result is Negative. Fact Sheet for Patients: SugarRoll.be Fact Sheet for Healthcare Providers: https://www.woods-mathews.com/ This test is not yet approved or cleared by the [place] FDA and  has been authorized for detection and/or diagnosis of SARS-CoV-2 by FDA under an Emergency Use Authorization (EUA). This EUA will remain  in effect (meaning this test can be used) for the duration of the COVID-19 declaration under Section 56 4(b)(1) of the Act, 21 U.S.C. section 360bbb-3(b)(1), unless the authorization is terminated or revoked sooner. Performed at [facility], [place] [address] [place], [place] [address]     Renal Function: Recent Labs    07/28/19 1447  CREATININE 0.60   Estimated Creatinine Clearance: 30.6 mL/min (by C-G formula based on SCr of 0.6 mg/dL).  Radiologic Imaging: Dg Pelvis Portable  Result Date: 07/31/2019 CLINICAL DATA:  Status post left hip replacement EXAM: PORTABLE PELVIS 1-2 VIEWS COMPARISON:  None. FINDINGS: Left hip replacement is noted in satisfactory position. Foley catheter is noted in within the bladder. Calcification is noted in the right hemipelvis which may be related to uterine fibroid. IMPRESSION: Status post left hip replacement Electronically Signed   By: Inez Catalina M.D.   On: 07/31/2019 14:37   Dg C-arm 1-60 Min-no Report  Result Date:  07/31/2019 Fluoroscopy was utilized by the requesting physician.  No radiographic interpretation.   Dg Hip Operative Unilat W Or W/o Pelvis Left  Result Date: 07/31/2019 CLINICAL DATA:  Left total hip replacement FLUOROSCOPY TIME:  16 seconds. EXAM: OPERATIVE LEFT HIP (WITH PELVIS IF PERFORMED) 4 VIEWS TECHNIQUE: Fluoroscopic spot image(s) were submitted for interpretation post-operatively. COMPARISON:  None. FINDINGS: The left hip is replaced throughout the study. The acetabular and femoral components are in good position. IMPRESSION: Left hip replacement as above. Electronically Signed   By: Dorise Bullion III M.D   On: 07/31/2019 13:33      Procedure note:  Following sterile prep, I negotiated a 14 French coude tip catheter into her bladder.  Meatus was somewhat hypospadiac in nature.  Clear urine was obtained.  Balloon was inflated with 10 cc of water and hooked to dependent drainage.  The patient was then awakened..  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  Remove catheter her normal routine.  Reconsult Urology for further issues.

## 2019-07-31 NOTE — Anesthesia Preprocedure Evaluation (Addendum)
Anesthesia Evaluation  Patient identified by MRN, date of birth, ID band Patient awake    Reviewed: Allergy & Precautions, NPO status , Patient's Chart, lab work & pertinent test results, reviewed documented beta blocker date and time   Airway Mallampati: II  TM Distance: >3 FB Neck ROM: Full    Dental no notable dental hx.    Pulmonary neg pulmonary ROS,    Pulmonary exam normal breath sounds clear to auscultation       Cardiovascular hypertension, Pt. on medications and Pt. on home beta blockers negative cardio ROS Normal cardiovascular exam Rhythm:Regular Rate:Normal     Neuro/Psych negative neurological ROS  negative psych ROS   GI/Hepatic negative GI ROS, Neg liver ROS,   Endo/Other  negative endocrine ROS  Renal/GU negative Renal ROS  negative genitourinary   Musculoskeletal  (+) Arthritis , Osteoarthritis,    Abdominal   Peds negative pediatric ROS (+)  Hematology negative hematology ROS (+)   Anesthesia Other Findings   Reproductive/Obstetrics negative OB ROS                             Anesthesia Physical Anesthesia Plan  ASA: III  Anesthesia Plan: Spinal   Post-op Pain Management:    Induction: Intravenous  PONV Risk Score and Plan: 2 and Ondansetron, Midazolam and Treatment may vary due to age or medical condition  Airway Management Planned: Simple Face Mask  Additional Equipment:   Intra-op Plan:   Post-operative Plan:   Informed Consent: I have reviewed the patients History and Physical, chart, labs and discussed the procedure including the risks, benefits and alternatives for the proposed anesthesia with the patient or authorized representative who has indicated his/her understanding and acceptance.     Dental advisory given  Plan Discussed with: CRNA  Anesthesia Plan Comments:         Anesthesia Quick Evaluation

## 2019-07-31 NOTE — Transfer of Care (Signed)
Immediate Anesthesia Transfer of Care Note  Patient: Mary Lara  Procedure(s) Performed: LEFT TOTAL HIP ARTHROPLASTY ANTERIOR APPROACH (Left Hip)  Patient Location: PACU  Anesthesia Type:Spinal  Level of Consciousness: awake, alert  and oriented  Airway & Oxygen Therapy: Patient Spontanous Breathing and Patient connected to face mask oxygen  Post-op Assessment: Report given to RN and Post -op Vital signs reviewed and stable  Post vital signs: Reviewed and stable  Last Vitals:  Vitals Value Taken Time  BP 94/45 07/31/19 1339  Temp    Pulse 77 07/31/19 1342  Resp 17 07/31/19 1342  SpO2 100 % 07/31/19 1342  Vitals shown include unvalidated device data.  Last Pain: There were no vitals filed for this visit.       Complications: No apparent anesthesia complications

## 2019-07-31 NOTE — Evaluation (Signed)
Physical Therapy Evaluation Patient Details Name: Mary Lara MRN: [id number] DOB: May 12, 1923 Today's Date: 07/31/2019   History of Present Illness  85 yo female s/p L DA-THA on 07/31/19. PMH includes cancer, HTN, OA.  Clinical Impression  Pt presents with L hip pain, decreased L hip strength post-operatively, difficulty performing mobility tasks, and decreased activity tolerance due to pain. Pt to benefit from acute PT to address deficits. Pt performed bed mobility and transfer with mod and min assist, respectively, and deferred further mobility due to severe R hip pain and weakness from not eating. Pt educated on ankle pumps (20/hour) to perform this afternoon/evening to increase circulation, to pt's tolerance and limited by pain. Pt's daughter from [place] took off work for 4-5 weeks to assist the pt at her home, PT feels pt is appropriate to d/c home given this information and based on eval. Will reassess next visit. PT to progress mobility as tolerated, and will continue to follow acutely.        Follow Up Recommendations Follow surgeon's recommendation for DC plan and follow-up therapies;Supervision for mobility/OOB(PT thinks pt is HHPT level, with 4 weeks 24/7 assist from daughter from [place])    Equipment Recommendations  3in1 (PT)    Recommendations for Other Services       Precautions / Restrictions Precautions Precautions: Fall Restrictions Weight Bearing Restrictions: No Other Position/Activity Restrictions: WBAT      Mobility  Bed Mobility Overal bed mobility: Needs Assistance Bed Mobility: Supine to Sit;Sit to Supine     Supine to sit: HOB elevated;Mod assist Sit to supine: HOB elevated;Mod assist   General bed mobility comments: Mod assist for supine<>sit for LE management, trunk elevation and lowering, scooting pt up in bed with use of bed pad.  Transfers Overall transfer level: Needs assistance Equipment used: Rolling walker (2 wheeled) Transfers: Sit  to/from Stand Sit to Stand: Min assist;From elevated surface         General transfer comment: Min assist for power up, steadying, hand placement when rising. Pt able to take small steps towards head of bed for repositioning, but pt defers further mobility due to pain.  Ambulation/Gait                Stairs            Wheelchair Mobility    Modified Rankin (Stroke Patients Only)       Balance Overall balance assessment: Needs assistance Sitting-balance support: No upper extremity supported Sitting balance-Leahy Scale: Good     Standing balance support: Bilateral upper extremity supported Standing balance-Leahy Scale: Poor                               Pertinent Vitals/Pain Pain Assessment: 0-10 Pain Score: 8  Pain Location: L hip Pain Descriptors / Indicators: Sore Pain Intervention(s): Limited activity within patient's tolerance;Monitored during session;Premedicated before session;Repositioned    Home Living Family/patient expects to be discharged to:: Skilled nursing facility Living Arrangements: Alone Available Help at Discharge: Family;Available 24 hours/day(son to stay with pt) Type of Home: House Home Access: Stairs to enter   [employer] of Steps: 3 Home Layout: One level Home Equipment: Walker - 2 wheels;Toilet riser      Prior Function Level of Independence: Independent with assistive device(s)         Comments: pt reports using RW for ambulation PTA     Hand Dominance   Dominant Hand: Right  Extremity/Trunk Assessment   Upper Extremity Assessment Upper Extremity Assessment: Defer to OT evaluation    Lower Extremity Assessment Lower Extremity Assessment: Generalized weakness;LLE deficits/detail LLE Deficits / Details: suspected post-surgical L hip weakness; able to perform ankle pumps, quad set, assisted heel slide LLE Sensation: WNL    Cervical / Trunk Assessment Cervical / Trunk Assessment:  Kyphotic  Communication   Communication: No difficulties  Cognition Arousal/Alertness: Awake/alert Behavior During Therapy: WFL for tasks assessed/performed Overall Cognitive Status: Within Functional Limits for tasks assessed                                        General Comments      Exercises     Assessment/Plan    PT Assessment Patient needs continued PT services  PT Problem List Decreased strength;Decreased mobility;Decreased range of motion;Decreased activity tolerance;Decreased balance;Decreased knowledge of use of DME;Pain       PT Treatment Interventions DME instruction;Therapeutic activities;Gait training;Patient/family education;Balance training;Stair training;Therapeutic exercise;Functional mobility training    PT Goals (Current goals can be found in the Care Plan section)  Acute Rehab PT Goals Patient Stated Goal: go home, with help PT Goal Formulation: With patient Time For Goal Achievement: 08/07/19 Potential to Achieve Goals: Good    Frequency 7X/week   Barriers to discharge        Co-evaluation               AM-PAC PT "6 Clicks" Mobility  Outcome Measure Help needed turning from your back to your side while in a flat bed without using bedrails?: A Lot Help needed moving from lying on your back to sitting on the side of a flat bed without using bedrails?: A Lot Help needed moving to and from a bed to a chair (including a wheelchair)?: A Little Help needed standing up from a chair using your arms (e.g., wheelchair or bedside chair)?: A Little Help needed to walk in hospital room?: A Little Help needed climbing 3-5 steps with a railing? : A Lot 6 Click Score: 15    End of Session Equipment Utilized During Treatment: Gait belt Activity Tolerance: Patient tolerated treatment well;Patient limited by pain Patient left: in bed;with bed alarm set;with call bell/phone within reach;with SCD's reapplied Nurse Communication: Mobility  status PT Visit Diagnosis: Other abnormalities of gait and mobility (R26.89);Difficulty in walking, not elsewhere classified (R26.2)    Time: [id number] PT Time Calculation (min) (ACUTE ONLY): 28 min   Charges:   PT Evaluation $PT Eval Low Complexity: 1 Low PT Treatments $Therapeutic Activity: 8-22 mins        Mary Lara, PT Acute Rehabilitation Services Pager [phone]  Office [phone]   Mary Lara 07/31/2019, 7:17 PM

## 2019-07-31 NOTE — Op Note (Signed)
NAMEJULYE, GORRA MEDICAL RECORD [id number] ACCOUNT [id number] DATE OF BIRTH:Aug 22, 1923 FACILITY: [facility] LOC[facility]: [facility] PHYSICIAN:CHRISTOPHER Kerry Fort, MD  OPERATIVE REPORT  DATE OF PROCEDURE:  07/31/2019  PREOPERATIVE DIAGNOSIS:  Severe end-stage arthritis and osteonecrosis, left hip.  POSTOPERATIVE DIAGNOSIS:  Severe end-stage arthritis and osteonecrosis, left hip.  PROCEDURE:  Left total hip arthroplasty through direct anterior approach.  IMPLANTS:  DePuy Sector Gription acetabular component size 48, size 32+0 neutral polyethylene liner, size 10 Corail femoral component with standard offset, size 32+1 metal hip ball and 1 screw in the acetabular component.  SURGEON:  Lind Guest. Ninfa Linden, MD  ASSISTANT:  Janine Ores, PA-C  ANESTHESIA:  Spinal.  ANTIBIOTICS:  900 mg IV clindamycin.  ESTIMATED BLOOD LOSS:  250 mL.  COMPLICATIONS:  None.  INDICATIONS:  The patient is a 85 year old frail female with debilitating end-stage arthritis and osteonecrosis of her left hip.  This has been well documented on x-rays and on clinical exam.  She is miserable and her pain is daily.  At this point, it is  detrimentally affecting her mobility, her quality of life, and activities of daily living to the point that she and her family wish for [place] to proceed with a total hip arthroplasty.  At this point, her severe hip pain and arthritis have detrimentally  affected her quality of life, her mobility, and activities of daily living.  We had a long and thorough discussion about the risk of acute blood loss anemia, nerve and vessel injury, fracture, infection, implant failure, dislocation, DVT.  We talked  about our goals being decreased pain, improved mobility, and overall improved quality of life.  DESCRIPTION OF PROCEDURE:  After informed consent was obtained and appropriate left hip was marked, she was brought to the operating room and sat up on a stretcher.  Spinal anesthesia was then  obtained.  She was laid in a supine position, and they made  numerous attempts to pass a Foley catheter but were unable to do so.  We then decided to consult urology to have them hopefully assess her and place a Foley catheter at the end of the case, because I still felt like that she would need it at age 25 given  her frailty.  We then assessed her leg lengths and found her left operative side to be certainly way shorter than the right side.  Traction boots were placed on both her feet.  Next, we placed her supine on the Hana fracture table, the perineal post in  place and both legs in in-line skeletal traction device and no traction applied.  Her left operative hip was prepped and draped with DuraPrep and sterile drapes.  A time-out was called, and she was identified as correct patient and correct left hip.  We  then made an incision just inferior and posterior to the anterior superior iliac spine and carried this obliquely down the leg.  We dissected down tensor fascia lata muscle.  Tensor fascia was then divided longitudinally to proceed with direct anterior  approach to the hip.  We identified and cauterized circumflex vessels.  Then identified the hip capsule, opened the hip capsule in an L-type format, finding a moderate to significant joint effusion, but no evidence of infection.  We basically found the  femoral head to be melting away.  We opened up the hip capsule in an L-type format and placed a Cobra retractor around the medial and lateral femoral neck and then made our femoral neck cut with an oscillating saw  proximal to the lesser trochanter.  We  completed this with an osteotome and placed a corkscrew guide in the femoral head and removed remnants of the femoral head of what was left of it.  We then placed a bent Hohmann over the medial acetabular rim and removed remnants of the acetabular labrum  and other debris.  We then began reaming from a size 42 reamer in stepwise increments up to a  size 47 with all reamers placed under direct visualization, the last reamer under direct fluoroscopy so we could obtain our depth of reaming, our inclination,  and anteversion.  I then placed the real DePuy Sector Gription acetabular component size 48 and a single screw and a 32+0 neutral polyethylene liner for that size acetabular component.  Attention was then turned to the femur.  With the leg externally  rotated to 120 degrees, extended and adducted, we were able to place a Mueller retractor medially and a Hohmann retractor behind the greater trochanter.  We released the lateral joint capsule and used a box-cutting osteotome to enter the femoral canal  and a rongeur to lateralize.  We then began broaching using the Corail broaching system from a size 8 going up to a size 10.  With the size 10 in place, we trialed a standard offset femoral neck and a 32+1 hip ball, reduced this in the acetabulum, and we  were very pleased with her offset and stability.  We did lengthen her as well.  We then dislocated the hip and removed the trial components.  We placed the real Corail femoral component size 10 with standard offset and the real 32+1 metal hip ball, and  again reduced this in the acetabulum.  We appreciated the stability.  We then irrigated the soft tissue with normal saline solution using pulsatile lavage.  We were able to close the joint capsule with interrupted #1 Ethibond suture, followed by closing  the tensor fascia with #1 Vicryl, 0 Vicryl to close the deep tissue, 2-0 Vicryl was used to close subcutaneous tissue, and interrupted staples were placed on the skin.  Xeroform and an Aquacel dressing were applied.  Prior to leaving the operating room,  urology was consulted for a Foley catheter placement.  We felt again that this was reasonable given her spinal anesthesia and her frailty.  Her acute blood loss anemia was only 250 mL.  She was stable throughout the case, but we still felt this was   prudent and reasonable.  LN/NUANCE  D:07/31/2019 T:07/31/2019 JOB:[id number]

## 2019-07-31 NOTE — H&P (Signed)
TOTAL HIP ADMISSION H&P  Patient is admitted for left total hip arthroplasty.  Subjective:  Chief Complaint: left hip pain  HPI: Mary Lara, 85 y.o. female, has a history of pain and functional disability in the left hip(s) due to arthritis and patient has failed non-surgical conservative treatments for greater than 12 weeks to include NSAID's and/or analgesics, corticosteriod injections, flexibility and strengthening excercises, supervised PT with diminished ADL's post treatment, use of assistive devices and activity modification.  Onset of symptoms was gradual starting 5 years ago with gradually worsening course since that time.The patient noted no past surgery on the left hip(s).  Patient currently rates pain in the left hip at 10 out of 10 with activity. Patient has night pain, worsening of pain with activity and weight bearing, trendelenberg gait, pain that interfers with activities of daily living and pain with passive range of motion. Patient has evidence of subchondral cysts, subchondral sclerosis, periarticular osteophytes and joint space narrowing by imaging studies. This condition presents safety issues increasing the risk of falls.  There is no current active infection.  Patient Active Problem List   Diagnosis Date Noted  . Unilateral primary osteoarthritis, left hip 02/18/2019   Past Medical History:  Diagnosis Date  . Cancer ([place])   . Ganglion cyst    R wrist  . Hypertension   . Osteoarthritis     Past Surgical History:  Procedure Laterality Date  . ABDOMINAL HYSTERECTOMY    . APPENDECTOMY    . BREAST BIOPSY    . SKIN CANCER EXCISION      No current facility-administered medications for this encounter.    Current Outpatient Medications  Medication Sig Dispense Refill Last Dose  . acetaminophen (TYLENOL) 325 MG tablet Take 325 mg by mouth every 6 (six) hours as needed for mild pain.      Marland Kitchen aspirin EC 81 MG tablet Take 81 mg by mouth daily.      Marland Kitchen b complex vitamins  tablet Take 1 tablet by mouth daily.      . calcium citrate-vitamin D (CITRACAL+D) 315-200 MG-UNIT tablet Take 1 tablet by mouth 2 (two) times daily.     . Cholecalciferol (VITAMIN D3) 25 MCG (1000 UT) CAPS Take 1,000 Units by mouth daily.      Marland Kitchen docusate sodium (COLACE) 100 MG capsule Take 100 mg by mouth daily as needed for mild constipation.      . furosemide (LASIX) 20 MG tablet Take 20 mg by mouth every other day.      Marland Kitchen HYDROcodone-acetaminophen (NORCO/VICODIN) 5-325 MG tablet Take 1 tablet by mouth 2 (two) times daily as needed for pain.     Marland Kitchen ibuprofen (ADVIL) 200 MG tablet Take 200 mg by mouth every 6 (six) hours as needed for moderate pain.     . metoprolol succinate (TOPROL-XL) 50 MG 24 hr tablet Take 50 mg by mouth daily.      . mirtazapine (REMERON) 7.5 MG tablet Take 3.75 mg by mouth at bedtime.      . Multiple Vitamins-Minerals (CERTAVITE SENIOR/ANTIOXIDANT) TABS Take 1 tablet by mouth daily.      . Omega-3 Fatty Acids (FISH OIL OMEGA-3 PO) 1,000 mg.      . Omega-3 Fatty Acids (FISH OIL) 1000 MG CAPS Take 1,000 mg by mouth daily.      . pantoprazole (PROTONIX) 40 MG tablet Take 40 mg by mouth daily before breakfast.      . Polyethyl Glycol-Propyl Glycol (SYSTANE OP) Place 1 drop into  both eyes 3 (three) times daily.      . simvastatin (ZOCOR) 20 MG tablet Take 20 mg by mouth at bedtime.       Allergies  Allergen Reactions  . Iodine Rash  . Sulfa Antibiotics     Headaches, but not sure     Social History   Tobacco Use  . Smoking status: Never Smoker  . Smokeless tobacco: Never Used  Substance Use Topics  . Alcohol use: Never    Frequency: Never    No family history on file.   Review of Systems  Musculoskeletal: Positive for joint pain.  All other systems reviewed and are negative.   Objective:  Physical Exam  Constitutional: She is oriented to person, place, and time. She appears well-developed and well-nourished.  HENT:  Head: Normocephalic and atraumatic.   Eyes: Pupils are equal, round, and reactive to light. EOM are normal.  Neck: Normal range of motion.  Cardiovascular: Normal rate.  Respiratory: Effort normal.  GI: Soft.  Musculoskeletal:     Left hip: She exhibits decreased range of motion, decreased strength, tenderness and bony tenderness.  Neurological: She is alert and oriented to person, place, and time.  Skin: Skin is warm.  Psychiatric: She has a normal mood and affect.    Vital signs in last 24 hours:    Labs:   Estimated body mass index is 19.41 kg/m as calculated from the following:   Height as of 07/28/19: 5' (1.524 m).   Weight as of 07/28/19: 45.1 kg.   Imaging Review Plain radiographs demonstrate severe degenerative joint disease of the left hip(s). The bone quality appears to be good for age and reported activity level.      Assessment/Plan:  End stage arthritis, left hip(s)  The patient history, physical examination, clinical judgement of the provider and imaging studies are consistent with end stage degenerative joint disease of the left hip(s) and total hip arthroplasty is deemed medically necessary. The treatment options including medical management, injection therapy, arthroscopy and arthroplasty were discussed at length. The risks and benefits of total hip arthroplasty were presented and reviewed. The risks due to aseptic loosening, infection, stiffness, dislocation/subluxation,  thromboembolic complications and other imponderables were discussed.  The patient acknowledged the explanation, agreed to proceed with the plan and consent was signed. Patient is being admitted for inpatient treatment for surgery, pain control, PT, OT, prophylactic antibiotics, VTE prophylaxis, progressive ambulation and ADL's and discharge planning.The patient is planning to be discharged to possibly skilled nursing depending on her mobility, pain control, and post-op progress.

## 2019-07-31 NOTE — Anesthesia Postprocedure Evaluation (Signed)
Anesthesia Post Note  Patient: Mary Lara  Procedure(s) Performed: LEFT TOTAL HIP ARTHROPLASTY ANTERIOR APPROACH (Left Hip)     Patient location during evaluation: PACU Anesthesia Type: Spinal Level of consciousness: awake and alert Pain management: pain level controlled Vital Signs Assessment: post-procedure vital signs reviewed and stable Respiratory status: spontaneous breathing and respiratory function stable Cardiovascular status: blood pressure returned to baseline and stable Postop Assessment: spinal receding Anesthetic complications: no    Last Vitals:  Vitals:   07/31/19 1339 07/31/19 1345  BP: (!) 94/45 (!) 102/55  Pulse: 79 73  Resp: 12 17  Temp: (!) 36.4 C   SpO2: 100% 100%    Last Pain:  Vitals:   07/31/19 1500  PainSc: 0-No pain                 Barnet Glasgow

## 2019-07-31 NOTE — Anesthesia Procedure Notes (Signed)
Spinal  Start time: 07/31/2019 11:50 AM End time: 07/31/2019 11:54 AM Staffing Resident/CRNA: Gean Maidens, CRNA Performed: resident/CRNA  Preanesthetic Checklist Completed: patient identified, site marked, surgical consent, pre-op evaluation, timeout performed, IV checked, risks and benefits discussed and monitors and equipment checked Spinal Block Patient position: sitting Prep: DuraPrep Patient monitoring: heart rate, continuous pulse ox and blood pressure Approach: midline Location: L3-4 Needle Needle type: Pencan  Needle gauge: 24 G Needle length: 9 cm Needle insertion depth: 5 cm Additional Notes Pt sitting position, sterile prep and drape, negative paresthesia/heme

## 2019-07-31 NOTE — Brief Op Note (Signed)
07/31/2019  1:20 PM  PATIENT:  Mary Lara  85 y.o. female  PRE-OPERATIVE DIAGNOSIS:  Osteoarthritis Left Hip  POST-OPERATIVE DIAGNOSIS:  Osteoarthritis Left Hip  PROCEDURE:  Procedure(s): LEFT TOTAL HIP ARTHROPLASTY ANTERIOR APPROACH (Left)  SURGEON:  Surgeon(s) and Role:    Mcarthur Rossetti, MD - Primary  PHYSICIAN ASSISTANT: Benita Stabile, PA-C  ANESTHESIA:   spinal  EBL:  250 mL   COUNTS:  YES  DICTATION: .Other Dictation: Dictation Number [id number]  PLAN OF CARE: Admit to inpatient   PATIENT DISPOSITION:  PACU - hemodynamically stable.   Delay start of Pharmacological VTE agent (>24hrs) due to surgical blood loss or risk of bleeding: no

## 2019-08-01 LAB — CBC
HCT: 28.4 % — ABNORMAL LOW (ref 36.0–46.0)
Hemoglobin: 9.3 g/dL — ABNORMAL LOW (ref 12.0–15.0)
MCH: 32.2 pg (ref 26.0–34.0)
MCHC: 32.7 g/dL (ref 30.0–36.0)
MCV: 98.3 fL (ref 80.0–100.0)
Platelets: 159 10*3/uL (ref 150–400)
RBC: 2.89 MIL/uL — ABNORMAL LOW (ref 3.87–5.11)
RDW: 14.6 % (ref 11.5–15.5)
WBC: 8 10*3/uL (ref 4.0–10.5)
nRBC: 0 % (ref 0.0–0.2)

## 2019-08-01 LAB — BASIC METABOLIC PANEL
Anion gap: 7 (ref 5–15)
BUN: 18 mg/dL (ref 8–23)
CO2: 28 mmol/L (ref 22–32)
Calcium: 8.7 mg/dL — ABNORMAL LOW (ref 8.9–10.3)
Chloride: 104 mmol/L (ref 98–111)
Creatinine, Ser: 0.55 mg/dL (ref 0.44–1.00)
GFR calc Af Amer: >60 mL/min (ref >60)
GFR calc non Af Amer: >60 mL/min (ref >60)
Glucose, Bld: 144 mg/dL — ABNORMAL HIGH (ref 70–99)
Potassium: 4.3 mmol/L (ref 3.5–5.1)
Sodium: 139 mmol/L (ref 135–145)

## 2019-08-01 MED ORDER — HYDROCODONE-ACETAMINOPHEN 5-325 MG PO TABS: 1.0000 | ORAL_TABLET | ORAL | 0 refills | Status: AC | PRN

## 2019-08-01 MED ORDER — GABAPENTIN 100 MG PO CAPS: 100.0000 mg | ORAL_CAPSULE | Freq: Three times a day (TID) | ORAL | 0 refills | Status: AC

## 2019-08-01 NOTE — Progress Notes (Addendum)
Subjective: 1 Day Post-Op Procedure(s) (LRB): LEFT TOTAL HIP ARTHROPLASTY ANTERIOR APPROACH (Left) Patient reports pain as moderate.  Feels anxious when talking. Denies chest pain or SOB.   Objective: Vital signs in last 24 hours: Temp:  [95 F (35 C)-98.6 F (37 C)] 98.6 F (37 C) (08/22 0518) Pulse Rate:  [68-104] 96 (08/22 0518) Resp:  [12-19] 16 (08/22 0518) BP: (94-144)/(45-64) 122/58 (08/22 0518) SpO2:  [98 %-100 %] 98 % (08/22 0518) Weight:  [45.1 kg] 45.1 kg (08/21 0911)  Intake/Output from previous day: 08/21 0701 - 08/22 0700 In: 2341.7 [P.O.:480; I.V.:1761.7; IV Piggyback:100] Out: 2450 [Urine:2200; Blood:250] Intake/Output this shift: Total I/O In: 120 [P.O.:120] Out: -   Recent Labs    08/01/19 0242  HGB 9.3*   Recent Labs    08/01/19 0242  WBC 8.0  RBC 2.89*  HCT 28.4*  PLT 159   Recent Labs    08/01/19 0242  NA 139  K 4.3  CL 104  CO2 28  BUN 18  CREATININE 0.55  GLUCOSE 144*  CALCIUM 8.7*   No results for input(s): LABPT, INR in the last 72 hours.  Left lower extremity: Dorsiflexion/Plantar flexion intact Incision: dressing C/D/I Compartment soft   Assessment/Plan: 1 Day Post-Op Procedure(s) (LRB): LEFT TOTAL HIP ARTHROPLASTY ANTERIOR APPROACH (Left) Up with therapy  Encourage incentive spirometry.   ABLA secondary to surgery will check CBC in AM. Monitor for symptoms of anemia. Hgb 9.2 from 12.2 pre-op.   Needs Repeat COVID test prior to discharge to home so therapy can work with her at home.   Needs to work with therapy today prior to discharge mod assist with therapy yesterday .  Will can to inpatient status from observation status.     Mary Lara 08/01/2019, 8:54 AM

## 2019-08-01 NOTE — Progress Notes (Signed)
    Home health agencies that serve [id number].        Home Health Agencies Search Results  Results List Table  Home Health Agency Information Quality of Patient Care Rating Patient Survey Summary Rating  [facility] [phone] 3 out of 5 stars 5 out of 5 stars  [facility] [phone] 4  out of 5 stars 3 out of 5 stars  [facility] [phone] 4 out of 5 stars 4 out of 5 stars  [facility] [phone] 4 out of 5 stars 4 out of 5 stars  [facility] [phone] 3 out of 5 stars 3 out of 5 stars  [facility] [phone] 3  out of 5 stars 4 out of 5 stars  [facility] [phone] 3 out of 5 stars 4 out of 5 stars  [facility] [phone] 4 out of 5 stars 5 out of 5 stars  [facility] [phone] 3  out of 5 stars 4 out of 5 stars  [facility] [phone] 2  out of 5 stars 4 out of 5 stars  [facility] [phone] 3 out of 5 stars 4 out of 5 stars  [facility] [phone] 4 out of 5 stars 4 out of 5 stars  [facility] [phone] 2  out of 5 stars 4 out of 5 stars  [facility] [phone] 4  out of 5 stars 3 out of 5 stars  [facility] [phone] 4  out of 5 stars 2 out of 5 stars   Home Health Footnotes  Footnote number Footnote as displayed on Home Health Compare  1 This agency provides services under a federal waiver program to non-traditional, chronic long term population.  2 This agency provides services to a special needs population.  3 Not Available.  4 The number of patient episodes for this measure is too small to report.  5 This measure currently does not have data or provider has been certified/recertified for less than 6 months.  6 The national average for this measure is not provided because of state-to-state differences in data  collection.  7 Medicare is not displaying rates for this measure for any home health agency, because of an issue with the data.  8 There were problems with the data and they are being corrected.  9 Zero, or very few, patients met the survey's rules for inclusion. The scores shown, if any, reflect a very small number of surveys and may not accurately tell how an agency is doing.  10 Survey results are based on less than 12 months of data.  11 Fewer than 70 patients completed the survey. Use the scores shown, if any, with caution as the number of surveys may be too low to accurately tell how an agency is doing.  12 No survey results are available for this period.  13 Data suppressed by CMS for one or more quarters.    

## 2019-08-01 NOTE — Progress Notes (Signed)
Physical Therapy Treatment Patient Details Name: Mary Lara MRN: [id number] DOB: 1923-08-18 Today's Date: 08/01/2019    History of Present Illness 85 yo female s/p L DA-THA on 07/31/19. PMH includes cancer, HTN, OA.    PT Comments    Pt progressing well. Able to amb 10' in room with min assist. Pt is not ready for d/c from PT standpoint and will benefit from continued PT in acute setting.    Follow Up Recommendations  Follow surgeon's recommendation for DC plan and follow-up therapies;Supervision for mobility/OOB(HHPT )     Equipment Recommendations  3in1 (PT)    Recommendations for Other Services       Precautions / Restrictions Precautions Precautions: Fall Restrictions Other Position/Activity Restrictions: WBAT    Mobility  Bed Mobility Overal bed mobility: Needs Assistance Bed Mobility: Supine to Sit     Supine to sit: Min assist;HOB elevated     General bed mobility comments: assist with LLE and to elevate trunk  Transfers Overall transfer level: Needs assistance Equipment used: Rolling walker (2 wheeled) Transfers: Sit to/from Stand Sit to Stand: Min assist         General transfer comment: assist to rise and transition to RW, incr time, cues for hand placement   Ambulation/Gait Ambulation/Gait assistance: Min assist Gait Distance (Feet): 10 Feet Assistive device: Rolling walker (2 wheeled) Gait Pattern/deviations: Step-to pattern     General Gait Details: cues for sequence, RW position   Stairs             Wheelchair Mobility    Modified Rankin (Stroke Patients Only)       Balance     Sitting balance-Leahy Scale: Good     Standing balance support: Bilateral upper extremity supported Standing balance-Leahy Scale: Poor                              Cognition Arousal/Alertness: Awake/alert Behavior During Therapy: WFL for tasks assessed/performed Overall Cognitive Status: Within Functional Limits for tasks  assessed                                        Exercises      General Comments        Pertinent Vitals/Pain Pain Assessment: Faces Faces Pain Scale: Hurts a little bit Pain Location: L hip Pain Descriptors / Indicators: Sore Pain Intervention(s): Limited activity within patient's tolerance;Monitored during session;Repositioned    Home Living                      Prior Function            PT Goals (current goals can now be found in the care plan section) Acute Rehab PT Goals Patient Stated Goal: go home, with help PT Goal Formulation: With patient Time For Goal Achievement: 08/07/19 Potential to Achieve Goals: Good Progress towards PT goals: Progressing toward goals    Frequency    7X/week      PT Plan Current plan remains appropriate    Co-evaluation PT/OT/SLP Co-Evaluation/Treatment: Yes Reason for Co-Treatment: For patient/therapist safety;To address functional/ADL transfers PT goals addressed during session: Mobility/safety with mobility;Proper use of DME OT goals addressed during session: ADL's and self-care      AM-PAC PT "6 Clicks" Mobility   Outcome Measure  Help needed turning from your back to your side while  in a flat bed without using bedrails?: A Little Help needed moving from lying on your back to sitting on the side of a flat bed without using bedrails?: A Little Help needed moving to and from a bed to a chair (including a wheelchair)?: A Little Help needed standing up from a chair using your arms (e.g., wheelchair or bedside chair)?: A Little Help needed to walk in hospital room?: A Little Help needed climbing 3-5 steps with a railing? : A Lot 6 Click Score: 17    End of Session Equipment Utilized During Treatment: Gait belt Activity Tolerance: Patient tolerated treatment well Patient left: in chair;with call bell/phone within reach;with chair alarm set   PT Visit Diagnosis: Other abnormalities of gait and  mobility (R26.89);Difficulty in walking, not elsewhere classified (R26.2)     Time: [id number] PT Time Calculation (min) (ACUTE ONLY): 16 min  Charges:  $Gait Training: 8-22 mins                     Kenyon Ana, PT  Pager: [phone] Acute Rehab Dept [facility]): [phone]   08/01/2019    [facility] 08/01/2019, 11:22 AM

## 2019-08-01 NOTE — Discharge Instructions (Signed)

## 2019-08-01 NOTE — Progress Notes (Signed)
Physical Therapy Treatment Patient Details Name: Mary Lara MRN: [id number] DOB: January 12, 1923 Today's Date: 08/01/2019    History of Present Illness 85 yo female s/p L DA-THA on 07/31/19. PMH includes cancer, HTN, OA.    PT Comments    Pt progressing well. Continues to require assist with transfers and gait stability improved with distance; continue PT POC  Follow Up Recommendations  Follow surgeon's recommendation for DC plan and follow-up therapies;Supervision for mobility/OOB     Equipment Recommendations  3in1 (PT)    Recommendations for Other Services       Precautions / Restrictions Precautions Precautions: Fall Restrictions Other Position/Activity Restrictions: WBAT    Mobility  Bed Mobility Overal bed mobility: Needs Assistance Bed Mobility: Sit to Supine       Sit to supine: Min assist   General bed mobility comments: assist with LLE, cues for technique  Transfers Overall transfer level: Needs assistance Equipment used: Rolling walker (2 wheeled) Transfers: Sit to/from Stand Sit to Stand: Min assist         General transfer comment: assist to rise and transition to RW, incr time, cues for hand placement  (posterior LOB on initial standing)  Ambulation/Gait Ambulation/Gait assistance: Min assist Gait Distance (Feet): 10 Feet(x2) Assistive device: Rolling walker (2 wheeled) Gait Pattern/deviations: Step-to pattern     General Gait Details: cues for sequence, RW position   Stairs             Wheelchair Mobility    Modified Rankin (Stroke Patients Only)       Balance             Standing balance-Leahy Scale: Poor                              Cognition Arousal/Alertness: Awake/alert Behavior During Therapy: WFL for tasks assessed/performed Overall Cognitive Status: Within Functional Limits for tasks assessed                                 General Comments: RN reports that cognition waxes and wanes.   WFLs during therapy session      Exercises Total Joint Exercises Ankle Circles/Pumps: AROM;Both;10 reps Quad Sets: AROM;Both;10 reps Heel Slides: AAROM;Right;10 reps Hip ABduction/ADduction: AAROM;Right;10 reps    General Comments        Pertinent Vitals/Pain Faces Pain Scale: Hurts a little bit Pain Location: L hip Pain Descriptors / Indicators: Sore Pain Intervention(s): Limited activity within patient's tolerance;Monitored during session;Repositioned(declined ice )    Home Living                      Prior Function            PT Goals (current goals can now be found in the care plan section) Acute Rehab PT Goals Patient Stated Goal: go home, with help PT Goal Formulation: With patient Time For Goal Achievement: 08/07/19 Potential to Achieve Goals: Good Progress towards PT goals: Progressing toward goals    Frequency    7X/week      PT Plan Current plan remains appropriate    Co-evaluation              AM-PAC PT "6 Clicks" Mobility   Outcome Measure  Help needed turning from your back to your side while in a flat bed without using bedrails?: A Little Help needed moving from lying  on your back to sitting on the side of a flat bed without using bedrails?: A Little Help needed moving to and from a bed to a chair (including a wheelchair)?: A Little Help needed standing up from a chair using your arms (e.g., wheelchair or bedside chair)?: A Little Help needed to walk in hospital room?: A Little Help needed climbing 3-5 steps with a railing? : A Lot 6 Click Score: 17    End of Session Equipment Utilized During Treatment: Gait belt Activity Tolerance: Patient tolerated treatment well Patient left: with call bell/phone within reach;in bed;with bed alarm set;with family/visitor present   PT Visit Diagnosis: Other abnormalities of gait and mobility (R26.89);Difficulty in walking, not elsewhere classified (R26.2)     Time: [id number] PT Time  Calculation (min) (ACUTE ONLY): 33 min  Charges:  $Gait Training: 8-22 mins $Therapeutic Exercise: 8-22 mins                     Kenyon Ana, PT  Pager: [phone] Acute Rehab Dept [facility]): [phone]   08/01/2019    [facility] 08/01/2019, 3:34 PM

## 2019-08-01 NOTE — Progress Notes (Signed)
Patient ID: Mary Lara, female   DOB: 08-Jul-1923, 85 y.o.   MRN: [id number] Although the patient has tested negative for COVID-19 prior to being admitted for her hip replacement yesterday, the home health agency that will be taking care of her at home is requiring a repeat COVID-19 test.  This will be done today, but due to testing limitations that were just passed along by ministration, the likelihood of having those results today is minimal.  Also, given her acute blood loss anemia and drop in Hgb/Hct, we will need to monitor her today prior to considering discharge in the next 24 to 48 hours.  She is a very frail 85 years old.

## 2019-08-01 NOTE — Plan of Care (Signed)
  Problem: Clinical Measurements: Goal: Respiratory complications will improve Outcome: Progressing   Problem: Clinical Measurements: Goal: Cardiovascular complication will be avoided Outcome: Progressing   Problem: Activity: Goal: Risk for activity intolerance will decrease Outcome: Progressing   Problem: Nutrition: Goal: Adequate nutrition will be maintained Outcome: Progressing   

## 2019-08-01 NOTE — Plan of Care (Signed)
  Problem: Education: Goal: Knowledge of the prescribed therapeutic regimen will improve Outcome: Progressing   Problem: Pain Management: Goal: Pain level will decrease with appropriate interventions Outcome: Progressing   Problem: Clinical Measurements: Goal: Postoperative complications will be avoided or minimized Outcome: Progressing   Problem: Skin Integrity: Goal: Will show signs of wound healing Outcome: Progressing   

## 2019-08-01 NOTE — Evaluation (Signed)
Occupational Therapy Evaluation Patient Details Name: Mary Lara MRN: [id number] DOB: [birth year] Today's Date: 08/01/2019    History of Present Illness 85 yo female s/p L DA-THA on 07/31/19. PMH includes cancer, HTN, OA.   Clinical Impression   Pt was admitted for the above sx. At baseline, she lives alone and is mod I for adls. Daughter plans to take off work and stay with her for 5 weeks.  Pt's cognition wfls during therapy session, but RN reports that she was confused earlier. Will follow in acute setting with min guard to min A level goals.  Hopefully, daughter can safely assist pt at home at time of d/c.    Follow Up Recommendations  Supervision/Assistance - 24 hour;SNF    Equipment Recommendations  3 in 1 bedside commode(possibly; has toilet riser; may need for next to bed)    Recommendations for Other Services       Precautions / Restrictions Precautions Precautions: Fall Restrictions Other Position/Activity Restrictions: WBAT      Mobility Bed Mobility Overal bed mobility: Needs Assistance Bed Mobility: Supine to Sit     Supine to sit: Min assist;HOB elevated     General bed mobility comments: assist with LLE and to elevate trunk  Transfers Overall transfer level: Needs assistance Equipment used: Rolling walker (2 wheeled) Transfers: Sit to/from Stand Sit to Stand: Min assist         General transfer comment: assist to rise and transition to RW, incr time, cues for hand placement     Balance     Sitting balance-Leahy Scale: Good     Standing balance support: Bilateral upper extremity supported Standing balance-Leahy Scale: Poor                             ADL either performed or assessed with clinical judgement   ADL Overall ADL's : Needs assistance/impaired Eating/Feeding: Independent   Grooming: Brushing hair;Set up   Upper Body Bathing: Set up   Lower Body Bathing: Maximal assistance;Sit to/from stand   Upper Body Dressing  : Set up   Lower Body Dressing: Maximal assistance;Sit to/from stand   Toilet Transfer: Minimal assistance;Ambulation;RW(chair)   Toileting- Clothing Manipulation and Hygiene: Minimal assistance;Sit to/from stand         General ADL Comments: performed UB adl sitting EOB     Vision         Perception     Praxis      Pertinent Vitals/Pain Pain Assessment: Faces Faces Pain Scale: Hurts a little bit Pain Location: L hip Pain Descriptors / Indicators: Sore Pain Intervention(s): Limited activity within patient's tolerance;Monitored during session;Repositioned     Hand Dominance Right   Extremity/Trunk Assessment Upper Extremity Assessment Upper Extremity Assessment: Generalized weakness           Communication Communication Communication: No difficulties   Cognition Arousal/Alertness: Awake/alert Behavior During Therapy: WFL for tasks assessed/performed Overall Cognitive Status: Within Functional Limits for tasks assessed                                 General Comments: RN reports that cognition waxes and wanes.  WFLs during therapy session   General Comments       Exercises     Shoulder Instructions      Home Living Family/patient expects to be discharged to:: Unsure Living Arrangements: Alone Available Help at Discharge: Family;Available 24  hours/day                         Home Equipment: Walker - 2 wheels;Toilet riser          Prior Functioning/Environment Level of Independence: Independent with assistive device(s)        Comments: pt reports using RW for ambulation PTA        OT Problem List: Decreased strength;Decreased activity tolerance;Impaired balance (sitting and/or standing);Decreased knowledge of use of DME or AE;Pain      OT Treatment/Interventions: Self-care/ADL training;DME and/or AE instruction;Therapeutic activities;Patient/family education    OT Goals(Current goals can be found in the care plan  section) Acute Rehab OT Goals Patient Stated Goal: go home, with help OT Goal Formulation: With patient Time For Goal Achievement: 08/15/19 Potential to Achieve Goals: Good ADL Goals Pt Will Perform Grooming: with min guard assist;standing Pt Will Perform Lower Body Bathing: with min assist;sit to/from stand Pt Will Transfer to Toilet: with min guard assist;bedside commode;ambulating Pt Will Perform Toileting - Clothing Manipulation and hygiene: with min guard assist;sit to/from stand  OT Frequency: Min 2X/week   Barriers to D/C:            Co-evaluation   Reason for Co-Treatment: For patient/therapist safety;To address functional/ADL transfers PT goals addressed during session: Mobility/safety with mobility;Proper use of DME OT goals addressed during session: ADL's and self-care      AM-PAC OT "6 Clicks" Daily Activity     Outcome Measure Help from another person eating meals?: None Help from another person taking care of personal grooming?: A Little Help from another person toileting, which includes using toliet, bedpan, or urinal?: A Lot Help from another person bathing (including washing, rinsing, drying)?: A Lot Help from another person to put on and taking off regular upper body clothing?: A Little Help from another person to put on and taking off regular lower body clothing?: A Lot 6 Click Score: 16   End of Session    Activity Tolerance: Patient tolerated treatment well Patient left: in chair;with call bell/phone within reach;with chair alarm set  OT Visit Diagnosis: Muscle weakness (generalized) (M62.81);Pain Pain - Right/Left: Left Pain - part of body: Hip                Time: [id number] OT Time Calculation (min): 20 min Charges:  OT General Charges $OT Visit: 1 Visit OT Evaluation $OT Eval Low Complexity: [place], OTR/L Acute Rehabilitation Services [phone] [facility] pager [phone] office 08/01/2019  [place] 08/01/2019, 12:06 PM

## 2019-08-01 NOTE — Care Management Obs Status (Signed)
[facility] NOTIFICATION   Patient Details  Name: Mary Lara MRN: [id number] Date of Birth: 01/19/1923   Medicare Observation Status Notification Given:  Yes    Joaquin Courts, RN 08/01/2019, 1:17 PM

## 2019-08-02 DIAGNOSIS — I10 Essential (primary) hypertension: Secondary | ICD-10-CM | POA: Diagnosis present

## 2019-08-02 DIAGNOSIS — Z90710 Acquired absence of both cervix and uterus: Secondary | ICD-10-CM | POA: Diagnosis not present

## 2019-08-02 DIAGNOSIS — Z85828 Personal history of other malignant neoplasm of skin: Secondary | ICD-10-CM | POA: Diagnosis not present

## 2019-08-02 DIAGNOSIS — D62 Acute posthemorrhagic anemia: Secondary | ICD-10-CM | POA: Diagnosis not present

## 2019-08-02 DIAGNOSIS — Z79891 Long term (current) use of opiate analgesic: Secondary | ICD-10-CM | POA: Diagnosis not present

## 2019-08-02 DIAGNOSIS — Z7982 Long term (current) use of aspirin: Secondary | ICD-10-CM | POA: Diagnosis not present

## 2019-08-02 DIAGNOSIS — Z882 Allergy status to sulfonamides status: Secondary | ICD-10-CM | POA: Diagnosis not present

## 2019-08-02 DIAGNOSIS — Z79899 Other long term (current) drug therapy: Secondary | ICD-10-CM | POA: Diagnosis not present

## 2019-08-02 DIAGNOSIS — N952 Postmenopausal atrophic vaginitis: Secondary | ICD-10-CM | POA: Diagnosis not present

## 2019-08-02 DIAGNOSIS — M25552 Pain in left hip: Secondary | ICD-10-CM | POA: Diagnosis present

## 2019-08-02 DIAGNOSIS — R54 Age-related physical debility: Secondary | ICD-10-CM | POA: Diagnosis not present

## 2019-08-02 DIAGNOSIS — Z888 Allergy status to other drugs, medicaments and biological substances status: Secondary | ICD-10-CM | POA: Diagnosis not present

## 2019-08-02 DIAGNOSIS — M1612 Unilateral primary osteoarthritis, left hip: Secondary | ICD-10-CM | POA: Diagnosis not present

## 2019-08-02 DIAGNOSIS — N3592 Unspecified urethral stricture, female: Secondary | ICD-10-CM | POA: Diagnosis not present

## 2019-08-02 DIAGNOSIS — Z20828 Contact with and (suspected) exposure to other viral communicable diseases: Secondary | ICD-10-CM | POA: Diagnosis not present

## 2019-08-02 DIAGNOSIS — M879 Osteonecrosis, unspecified: Secondary | ICD-10-CM | POA: Diagnosis not present

## 2019-08-02 DIAGNOSIS — Z791 Long term (current) use of non-steroidal anti-inflammatories (NSAID): Secondary | ICD-10-CM | POA: Diagnosis not present

## 2019-08-02 LAB — CBC
HCT: 29.4 % — ABNORMAL LOW (ref 36.0–46.0)
Hemoglobin: 9.6 g/dL — ABNORMAL LOW (ref 12.0–15.0)
MCH: 32 pg (ref 26.0–34.0)
MCHC: 32.7 g/dL (ref 30.0–36.0)
MCV: 98 fL (ref 80.0–100.0)
Platelets: 170 10*3/uL (ref 150–400)
RBC: 3 MIL/uL — ABNORMAL LOW (ref 3.87–5.11)
RDW: 14.7 % (ref 11.5–15.5)
WBC: 11 10*3/uL — ABNORMAL HIGH (ref 4.0–10.5)
nRBC: 0 % (ref 0.0–0.2)

## 2019-08-02 LAB — NOVEL CORONAVIRUS, NAA (HOSP ORDER, SEND-OUT TO REF LAB; TAT 18-24 HRS): SARS-CoV-2, NAA: NOT DETECTED

## 2019-08-02 MED ORDER — ASPIRIN 81 MG PO CHEW: 81.0000 mg | CHEWABLE_TABLET | Freq: Two times a day (BID) | ORAL | 0 refills | Status: AC

## 2019-08-02 NOTE — Progress Notes (Addendum)
Physical Therapy Treatment Patient Details Name: Mary Lara MRN: [id number] DOB: 05/15/1923 Today's Date: 08/02/2019    History of Present Illness 85 yo female s/p L DA-THA on 07/31/19. PMH includes cancer, HTN, OA.    PT Comments    Pt  Incontinent of stool/urgency this pm; assisted to [facility]  And with hygiene with RN assist.  Discussed progress with dtr. Pt dtr will need assist in addition to HHPT/HHOT to care for pt. Pt dtr reports she has a caregiver to assist 4hrs per day and also has hired assist for the evening as well;   Pt is improving but initially requires near mod assist and consistently demonstrates  posterior LOB with initial transitions, then progresses to min/min-guard with continued mobility and cuing for technique and safety.     Follow Up Recommendations  Follow surgeon's recommendation for DC plan and follow-up therapies;Supervision for mobility/OOB     Equipment Recommendations  3in1 (PT)    Recommendations for Other Services       Precautions / Restrictions Precautions Precautions: Fall Restrictions Weight Bearing Restrictions: No Other Position/Activity Restrictions: WBAT    Mobility  Bed Mobility Overal bed mobility: Needs Assistance Bed Mobility: Sit to Supine     Supine to sit: Mod assist Sit to supine: Min assist   General bed mobility comments: assist with LLE, cues for technique  Transfers Overall transfer level: Needs assistance Equipment used: Rolling walker (2 wheeled) Transfers: Sit to/from [facility] Sit to Stand: Min assist;Mod assist Stand pivot transfers: Mod assist       General transfer comment: assist to rise and transition to RW, incr time, cues for hand placement  (posterior LOB on initial standing). assist for balance and to maneuver RW fo rstand pivot  Ambulation/Gait Ambulation/Gait assistance: Min assist;Min guard Gait Distance (Feet): 60 Feet Assistive device: Rolling walker (2 wheeled) Gait  Pattern/deviations: Step-to pattern;Decreased step length - right;Decreased step length - left     General Gait Details: cues for sequence, trunk extension, RW position from self    Stairs             Wheelchair Mobility    Modified Rankin (Stroke Patients Only)       Balance     Sitting balance-Leahy Scale: Good     Standing balance support: Bilateral upper extremity supported Standing balance-Leahy Scale: Poor                              Cognition Arousal/Alertness: Awake/alert Behavior During Therapy: WFL for tasks assessed/performed Overall Cognitive Status: Impaired/Different from baseline Area of Impairment: Orientation;Memory;Safety/judgement                 Orientation Level: Disoriented to;Place   Memory: Decreased short-term memory   Safety/Judgement: Decreased awareness of safety;Decreased awareness of deficits     General Comments: cognition waxes and wanes; confused at times, repeats self, does not recall events of earlier today; follows one step commands consistently      Exercises Total Joint Exercises Ankle Circles/Pumps: AROM;Both;10 reps Heel Slides: 5 reps;Both;AROM;AAROM    General Comments        Pertinent Vitals/Pain Pain Assessment: Faces Faces Pain Scale: Hurts a little bit Pain Location: L hip Pain Descriptors / Indicators: Sore Pain Intervention(s): Limited activity within patient's tolerance;Monitored during session;Repositioned    Home Living  Prior Function            PT Goals (current goals can now be found in the care plan section) Acute Rehab PT Goals Patient Stated Goal: go home, with help PT Goal Formulation: With patient Time For Goal Achievement: 08/07/19 Potential to Achieve Goals: Good Progress towards PT goals: Progressing toward goals(slowly)    Frequency    7X/week      PT Plan Current plan remains appropriate    Co-evaluation               AM-PAC PT "6 Clicks" Mobility   Outcome Measure  Help needed turning from your back to your side while in a flat bed without using bedrails?: A Little Help needed moving from lying on your back to sitting on the side of a flat bed without using bedrails?: A Little Help needed moving to and from a bed to a chair (including a wheelchair)?: A Little Help needed standing up from a chair using your arms (e.g., wheelchair or bedside chair)?: A Little Help needed to walk in hospital room?: A Little Help needed climbing 3-5 steps with a railing? : A Lot 6 Click Score: 17    End of Session Equipment Utilized During Treatment: Gait belt Activity Tolerance: Patient tolerated treatment well Patient left: in bed;with call bell/phone within reach;with bed alarm set;with family/visitor present Nurse Communication: Mobility status PT Visit Diagnosis: Other abnormalities of gait and mobility (R26.89);Difficulty in walking, not elsewhere classified (R26.2)     Time: [id number] PT Time Calculation (min) (ACUTE ONLY): 45 min  Charges:  $Gait Training: 8-22 mins $Therapeutic Activity: 23-37 mins                     Kenyon Ana, PT  Pager: [phone] Acute Rehab Dept [facility]): [phone]   08/02/2019    [facility] 08/02/2019, 1:49 PM

## 2019-08-02 NOTE — Plan of Care (Signed)
  Problem: Nutrition: Goal: Adequate nutrition will be maintained Outcome: Progressing   Problem: Coping: Goal: Level of anxiety will decrease Outcome: Progressing   Problem: Pain Managment: Goal: General experience of comfort will improve Outcome: Progressing   Problem: Activity: Goal: Ability to tolerate increased activity will improve Outcome: Progressing

## 2019-08-02 NOTE — Plan of Care (Signed)
  Problem: Activity: Goal: Risk for activity intolerance will decrease Outcome: Progressing   Problem: Nutrition: Goal: Adequate nutrition will be maintained Outcome: Progressing   Problem: Coping: Goal: Level of anxiety will decrease Outcome: Progressing   Problem: Pain Managment: Goal: General experience of comfort will improve Outcome: Progressing   Problem: Safety: Goal: Ability to remain free from injury will improve Outcome: Progressing   

## 2019-08-02 NOTE — Progress Notes (Addendum)
Occupational Therapy Treatment Patient Details Name: Mary Lara MRN: [id number] DOB: [birth year] Today's Date: 08/02/2019    History of present illness 85 yo female s/p L DA-THA on 07/31/19. PMH includes cancer, HTN, OA.   OT comments  Pt making gradual progress towards OT goals. Pt sleepy this afternoon and required initial cues to remain awake/alert. She continues to require min-modA for functional transfers using RW, including increased time and cues for technique. Pt completing toileting ADL during session with modA. If pt to discharge home recommend she have hands on 24hr supervision/assist as she continues to remain at a higher risk for falls. Will continue per POC.   Follow Up Recommendations  Supervision/Assistance - 24 hour;SNF    Equipment Recommendations  3 in 1 bedside commode          Precautions / Restrictions Precautions Precautions: Fall Restrictions Other Position/Activity Restrictions: WBAT       Mobility Bed Mobility Overal bed mobility: Needs Assistance Bed Mobility: Sit to Supine;Supine to Sit     Supine to sit: Mod assist Sit to supine: Mod assist   General bed mobility comments: assist with LLE and trunk, cues for technique  Transfers Overall transfer level: Needs assistance Equipment used: Rolling walker (2 wheeled) Transfers: Sit to/from [facility] Sit to Stand: Min assist;Mod assist Stand pivot transfers: Min assist       General transfer comment: assist to rise and transition to RW, incr time, cues for hand placement  (posterior LOB on initial standing). assist for balance and to maneuver RW for stand pivot    Balance Overall balance assessment: Needs assistance   Sitting balance-Leahy Scale: Good     Standing balance support: Bilateral upper extremity supported Standing balance-Leahy Scale: Poor                             ADL either performed or assessed with clinical judgement   ADL Overall ADL's :  Needs assistance/impaired     Grooming: Set up;Sitting                   Toilet Transfer: Minimal assistance;Stand-pivot;BSC;RW   Toileting- Clothing Manipulation and Hygiene: Moderate assistance;Sit to/from stand Toileting - Clothing Manipulation Details (indicate cue type and reason): requires assist for peri-care after BM, pt able to manage clothing (gown and mesh underwear) with minA for standing balance     Functional mobility during ADLs: Minimal assistance;Rolling walker                         Cognition Arousal/Alertness: Awake/alert;Lethargic(lethargic initially) Behavior During Therapy: WFL for tasks assessed/performed Overall Cognitive Status: Impaired/Different from baseline Area of Impairment: Memory;Safety/judgement                 Orientation Level: Disoriented to;Place   Memory: Decreased short-term memory   Safety/Judgement: Decreased awareness of safety;Decreased awareness of deficits     General Comments: cognition waxes and wanes; confused at times, repeats self; follows one step commands consistently        Exercises     Shoulder Instructions       General Comments      Pertinent Vitals/ Pain       Pain Assessment: Faces Faces Pain Scale: Hurts a little bit Pain Location: L hip Pain Descriptors / Indicators: Sore Pain Intervention(s): Repositioned;Limited activity within patient's tolerance;Monitored during session  Home Living  Prior Functioning/Environment              Frequency  Min 2X/week        Progress Toward Goals  OT Goals(current goals can now be found in the care plan section)  Progress towards OT goals: Progressing toward goals  Acute Rehab OT Goals Patient Stated Goal: go home, with help OT Goal Formulation: With patient Time For Goal Achievement: 08/15/19 Potential to Achieve Goals: Good ADL Goals Pt Will Perform Grooming: with  min guard assist;standing Pt Will Perform Lower Body Bathing: with min assist;sit to/from stand Pt Will Transfer to Toilet: with min guard assist;bedside commode;ambulating Pt Will Perform Toileting - Clothing Manipulation and hygiene: with min guard assist;sit to/from stand  Plan Discharge plan remains appropriate    Co-evaluation                 AM-PAC OT "6 Clicks" Daily Activity     Outcome Measure   Help from another person eating meals?: None Help from another person taking care of personal grooming?: A Little Help from another person toileting, which includes using toliet, bedpan, or urinal?: A Lot Help from another person bathing (including washing, rinsing, drying)?: A Lot Help from another person to put on and taking off regular upper body clothing?: A Little Help from another person to put on and taking off regular lower body clothing?: A Lot 6 Click Score: 16    End of Session Equipment Utilized During Treatment: Gait belt;Rolling walker  OT Visit Diagnosis: Muscle weakness (generalized) (M62.81);Pain Pain - Right/Left: Left Pain - part of body: Hip   Activity Tolerance Patient tolerated treatment well   Patient Left in bed;with call bell/phone within reach;with bed alarm set   Nurse Communication Mobility status        Time: [id number] OT Time Calculation (min): 27 min  Charges: OT General Charges $OT Visit: 1 Visit OT Treatments $Self Care/Home Management : 23-37 mins  Lou Cal, [place] Pager [phone] Office [phone]    Raymondo Band 08/02/2019, 4:28 PM

## 2019-08-02 NOTE — Progress Notes (Signed)
Patient ID: Mary Lara, female   DOB: 1923/04/18, 85 y.o.   MRN: [id number] Feels good overall.  Vitals stable.  H/H stable.  Working with therapy.  Certainly, at age 37, she is frail and a fall risk.  I will put in for a discharge later this afternoon.  However, if she is deemed unsafe to go home yet from PT and nursing, she will need to stay one more day and be made an inpatient admission.

## 2019-08-02 NOTE — Progress Notes (Signed)
Physical Therapy Treatment Patient Details Name: Mary Lara MRN: [id number] DOB: 1923/03/25 Today's Date: 08/02/2019    History of Present Illness 85 yo female s/p L DA-THA on 07/31/19. PMH includes cancer, HTN, OA.    PT Comments    Pt progressing slowly; requiring incr assist with bed mobility this am, very lethargic initially but aroused with multi-modal stimuli. Pt not safe to d/c with family assist due to incr risk of falls . Will continue PT POC in acute setting.  Follow Up Recommendations  Follow surgeon's recommendation for DC plan and follow-up therapies;Supervision for mobility/OOB     Equipment Recommendations  3in1 (PT)    Recommendations for Other Services       Precautions / Restrictions Precautions Precautions: Fall Restrictions Weight Bearing Restrictions: No Other Position/Activity Restrictions: WBAT    Mobility  Bed Mobility Overal bed mobility: Needs Assistance Bed Mobility: Supine to Sit     Supine to sit: Mod assist     General bed mobility comments: assist with LLE, cues for technique  Transfers Overall transfer level: Needs assistance Equipment used: Rolling walker (2 wheeled) Transfers: Sit to/from Stand Sit to Stand: Min assist;Mod assist         General transfer comment: assist to rise and transition to RW, incr time, cues for hand placement  (posterior LOB on initial standing)  Ambulation/Gait Ambulation/Gait assistance: Min assist Gait Distance (Feet): 50 Feet Assistive device: Rolling walker (2 wheeled) Gait Pattern/deviations: Step-to pattern     General Gait Details: cues for sequence, RW position   Stairs             Wheelchair Mobility    Modified Rankin (Stroke Patients Only)       Balance     Sitting balance-Leahy Scale: Good     Standing balance support: Bilateral upper extremity supported Standing balance-Leahy Scale: Poor                              Cognition Arousal/Alertness:  Awake/alert Behavior During Therapy: WFL for tasks assessed/performed Overall Cognitive Status: Within Functional Limits for tasks assessed                                        Exercises Total Joint Exercises Ankle Circles/Pumps: AROM;Both;10 reps Heel Slides: 5 reps;Both;AROM;AAROM    General Comments        Pertinent Vitals/Pain Pain Assessment: Faces Faces Pain Scale: Hurts a little bit Pain Location: L hip Pain Descriptors / Indicators: Sore Pain Intervention(s): Limited activity within patient's tolerance;Monitored during session;Repositioned    Home Living                      Prior Function            PT Goals (current goals can now be found in the care plan section) Acute Rehab PT Goals Patient Stated Goal: go home, with help PT Goal Formulation: With patient Time For Goal Achievement: 08/07/19 Potential to Achieve Goals: Good Progress towards PT goals: Progressing toward goals    Frequency    7X/week      PT Plan Current plan remains appropriate    Co-evaluation              AM-PAC PT "6 Clicks" Mobility   Outcome Measure  Help needed turning from your back to your  side while in a flat bed without using bedrails?: A Little Help needed moving from lying on your back to sitting on the side of a flat bed without using bedrails?: A Little Help needed moving to and from a bed to a chair (including a wheelchair)?: A Lot Help needed standing up from a chair using your arms (e.g., wheelchair or bedside chair)?: A Little Help needed to walk in hospital room?: A Little Help needed climbing 3-5 steps with a railing? : A Lot 6 Click Score: 16    End of Session Equipment Utilized During Treatment: Gait belt Activity Tolerance: Patient tolerated treatment well Patient left: in chair;with call bell/phone within reach;with chair alarm set Nurse Communication: Mobility status PT Visit Diagnosis: Other abnormalities of gait and  mobility (R26.89);Difficulty in walking, not elsewhere classified (R26.2)     Time: [id number] PT Time Calculation (min) (ACUTE ONLY): 32 min  Charges:  $Gait Training: 8-22 mins $Therapeutic Activity: 8-22 mins                     Kenyon Ana, PT  Pager: [phone] Acute Rehab Dept [facility]): [id number]   08/02/2019    [facility] 08/02/2019, 11:59 AM

## 2019-08-03 ENCOUNTER — Encounter (HOSPITAL_COMMUNITY): Payer: Self-pay | Admitting: Orthopaedic Surgery

## 2019-08-03 NOTE — Progress Notes (Signed)
Physical Therapy Treatment Patient Details Name: Mary Lara MRN: [id number] DOB: Candia 22, 1924 Today's Date: 08/03/2019    History of Present Illness 85 yo female s/p L DA-THA on 07/31/19. PMH includes cancer, HTN, OA.    PT Comments    Pt progressing well; reviewed stairs with pt and dtr ([name]).   Pt doing much better overall today. Reviewed safety and progress with pt and dtr.  dtr in agreement with  d/c today, ready from PT standpoint  Follow Up Recommendations  Home health PT;Supervision/Assistance - 24 hour     Equipment Recommendations  3in1 (PT)    Recommendations for Other Services       Precautions / Restrictions Precautions Precautions: Fall Restrictions Weight Bearing Restrictions: No Other Position/Activity Restrictions: WBAT    Mobility  Bed Mobility Overal bed mobility: Needs Assistance Bed Mobility: Sit to Supine     Supine to sit: Mod assist Sit to supine: Min assist   General bed mobility comments: assist with LLE and trunk, cues for technique  Transfers Overall transfer level: Needs assistance Equipment used: Rolling walker (2 wheeled) Transfers: Sit to/from Stand Sit to Stand: Min guard Stand pivot transfers: Min assist       General transfer comment: cues for hand placement and anterior-superior wt shift   Ambulation/Gait Ambulation/Gait assistance: Supervision;Min guard Gait Distance (Feet): 50 Feet Assistive device: Rolling walker (2 wheeled) Gait Pattern/deviations: Step-to pattern;Decreased step length - right;Decreased step length - left     General Gait Details: cues for sequence, trunk extension, RW position from self    Stairs Stairs: Yes Stairs assistance: Min assist Stair Management: One rail Left;One rail Right;Step to pattern;Sideways Number of Stairs: 5 General stair comments: cues for technique and sequence    Wheelchair Mobility    Modified Rankin (Stroke Patients Only)       Balance Overall balance  assessment: Needs assistance   Sitting balance-Leahy Scale: Good     Standing balance support: Bilateral upper extremity supported Standing balance-Leahy Scale: Poor                              Cognition Arousal/Alertness: Awake/alert Behavior During Therapy: WFL for tasks assessed/performed Overall Cognitive Status: Within Functional Limits for tasks assessed                                        Exercises Total Joint Exercises Ankle Circles/Pumps: AROM;Both;10 reps Long Arc Quad: AROM;10 reps;Seated    General Comments        Pertinent Vitals/Pain Pain Assessment: 0-10 Pain Score: 3  Faces Pain Scale: Hurts a little bit Pain Location: L hip Pain Descriptors / Indicators: Sore Pain Intervention(s): Limited activity within patient's tolerance;Monitored during session    Home Living                      Prior Function            PT Goals (current goals can now be found in the care plan section) Acute Rehab PT Goals Patient Stated Goal: go home, with help PT Goal Formulation: With patient Time For Goal Achievement: 08/07/19 Potential to Achieve Goals: Good Progress towards PT goals: Progressing toward goals    Frequency    7X/week      PT Plan Current plan remains appropriate    Co-evaluation  AM-PAC PT "6 Clicks" Mobility   Outcome Measure  Help needed turning from your back to your side while in a flat bed without using bedrails?: A Little Help needed moving from lying on your back to sitting on the side of a flat bed without using bedrails?: A Little Help needed moving to and from a bed to a chair (including a wheelchair)?: A Little Help needed standing up from a chair using your arms (e.g., wheelchair or bedside chair)?: A Little Help needed to walk in hospital room?: A Little Help needed climbing 3-5 steps with a railing? : A Little 6 Click Score: 18    End of Session Equipment Utilized  During Treatment: Gait belt Activity Tolerance: Patient tolerated treatment well Patient left: with call bell/phone within reach;in bed;with bed alarm set;with family/visitor present Nurse Communication: Mobility status PT Visit Diagnosis: Other abnormalities of gait and mobility (R26.89);Difficulty in walking, not elsewhere classified (R26.2)     Time: [id number] PT Time Calculation (min) (ACUTE ONLY): 29 min  Charges:  $Gait Training: 23-37 mins                     Kenyon Ana, PT  Pager: [phone] Acute Rehab Dept [facility]): [id number]   08/03/2019    [facility] 08/03/2019, 3:25 PM

## 2019-08-03 NOTE — Progress Notes (Signed)
Occupational Therapy Treatment Patient Details Name: Mary Lara MRN: [id number] DOB: 1923/05/01 Today's Date: 08/03/2019    History of present illness 85 yo female s/p L DA-THA on 07/31/19. PMH includes cancer, HTN, OA.   OT comments  Pt is worried about her daughter being able to A her as needed.  Pt does not want to to go to a SNF  Follow Up Recommendations  Supervision/Assistance - 24 hour;SNF;Home health OT(depending on daughters ability to A)    Equipment Recommendations  3 in 1 bedside commode    Recommendations for Other Services      Precautions / Restrictions Precautions Precautions: Fall Restrictions Weight Bearing Restrictions: No Other Position/Activity Restrictions: WBAT       Mobility Bed Mobility Overal bed mobility: Needs Assistance Bed Mobility: Supine to Sit     Supine to sit: Mod assist     General bed mobility comments: assist with LLE and trunk, cues for technique  Transfers Overall transfer level: Needs assistance Equipment used: Rolling walker (2 wheeled) Transfers: Sit to/from [facility] Sit to Stand: Min assist Stand pivot transfers: Min assist       General transfer comment: cues for hand placement and anterior-superior wt shift     Balance Overall balance assessment: Needs assistance   Sitting balance-Leahy Scale: Good     Standing balance support: Bilateral upper extremity supported Standing balance-Leahy Scale: Poor                             ADL either performed or assessed with clinical judgement   ADL Overall ADL's : Needs assistance/impaired Eating/Feeding: Set up;Sitting   Grooming: Set up;Sitting   Upper Body Bathing: Set up;Sitting   Lower Body Bathing: Maximal assistance;Sit to/from stand;Cueing for safety;Cueing for sequencing   Upper Body Dressing : Minimal assistance;Sitting   Lower Body Dressing: Maximal assistance;Sit to/from stand;Cueing for safety;Cueing for compensatory  techniques   Toilet Transfer: Minimal assistance;Stand-pivot;BSC;RW   Toileting- Clothing Manipulation and Hygiene: Sit to/from stand;Minimal assistance         General ADL Comments: pt worried about her daugther being able to A her.  Daughter not present for OT session.               Cognition Arousal/Alertness: Awake/alert Behavior During Therapy: WFL for tasks assessed/performed Overall Cognitive Status: Within Functional Limits for tasks assessed                                          Exercises Total Joint Exercises Ankle Circles/Pumps: AROM;Both;10 reps Long Arc Quad: AROM;10 reps;Seated   Shoulder Instructions       General Comments      Pertinent Vitals/ Pain       Pain Assessment: 0-10 Pain Score: 3  Faces Pain Scale: Hurts a little bit Pain Location: L hip Pain Descriptors / Indicators: Sore Pain Intervention(s): Limited activity within patient's tolerance         Frequency  Min 2X/week        Progress Toward Goals  OT Goals(current goals can now be found in the care plan section)  Progress towards OT goals: Progressing toward goals  Acute Rehab OT Goals Patient Stated Goal: go home, with help  Plan Discharge plan remains appropriate       AM-PAC OT "6 Clicks" Daily Activity  Outcome Measure   Help from another person eating meals?: None Help from another person taking care of personal grooming?: A Little Help from another person toileting, which includes using toliet, bedpan, or urinal?: A Lot Help from another person bathing (including washing, rinsing, drying)?: A Lot Help from another person to put on and taking off regular upper body clothing?: A Little Help from another person to put on and taking off regular lower body clothing?: A Lot 6 Click Score: 16    End of Session Equipment Utilized During Treatment: Gait belt;Rolling walker  OT Visit Diagnosis: Muscle weakness (generalized) (M62.81);Pain Pain -  Right/Left: Left Pain - part of body: Hip   Activity Tolerance Patient tolerated treatment well   Patient Left with call bell/phone within reach;in chair;with chair alarm set   Nurse Communication Mobility status        Time: [id number] OT Time Calculation (min): 24 min  Charges: OT General Charges $OT Visit: 1 Visit OT Treatments $Self Care/Home Management : 23-37 mins  Kari Baars, [place] [phone] Office- [phone]      Jayziah Bankhead, Edwena Felty D 08/03/2019, 1:34 PM

## 2019-08-03 NOTE — Progress Notes (Signed)
Patient ID: Mary Lara, female   DOB: Apr 08, 1923, 85 y.o.   MRN: [id number] Stable this am.  According to therapists notes, she is mobilizing very slowly.  Hopefully can improve mobility today and can be discharged safely to home later today.  May need to consider skilled nursing placement.

## 2019-08-03 NOTE — Progress Notes (Signed)
Physical Therapy Treatment Patient Details Name: Mary Lara FORT MRN: [id number] DOB: 06/13/1923 Today's Date: 08/03/2019    History of Present Illness 85 yo female s/p L DA-THA on 07/31/19. PMH includes cancer, HTN, OA.    PT Comments    Pt  Progressing well, incr amb distance today. Will see again in pm--possible stairs.   Follow Up Recommendations  Follow surgeon's recommendation for DC plan and follow-up therapies;Supervision for mobility/OOB;Home health PT     Equipment Recommendations  3in1 (PT)    Recommendations for Other Services       Precautions / Restrictions Precautions Precautions: Fall Restrictions Weight Bearing Restrictions: No Other Position/Activity Restrictions: WBAT    Mobility  Bed Mobility               General bed mobility comments: in chair   Transfers Overall transfer level: Needs assistance Equipment used: Rolling walker (2 wheeled) Transfers: Sit to/from Stand Sit to Stand: Min guard         General transfer comment: cues for hand placement and anterior-superior wt shift   Ambulation/Gait Ambulation/Gait assistance: Supervision;Min guard Gait Distance (Feet): 130 Feet Assistive device: Rolling walker (2 wheeled) Gait Pattern/deviations: Step-to pattern;Decreased step length - right;Decreased step length - left     General Gait Details: cues for sequence, trunk extension, RW position from self    Stairs             Wheelchair Mobility    Modified Rankin (Stroke Patients Only)       Balance             Standing balance-Leahy Scale: Poor                              Cognition Arousal/Alertness: Awake/alert Behavior During Therapy: WFL for tasks assessed/performed Overall Cognitive Status: Within Functional Limits for tasks assessed                                        Exercises Total Joint Exercises Ankle Circles/Pumps: AROM;Both;10 reps Long Arc Quad: AROM;10  reps;Seated    General Comments        Pertinent Vitals/Pain Pain Assessment: Faces Faces Pain Scale: Hurts a little bit Pain Location: L hip Pain Descriptors / Indicators: Sore Pain Intervention(s): Monitored during session    Home Living                      Prior Function            PT Goals (current goals can now be found in the care plan section) Acute Rehab PT Goals Patient Stated Goal: go home, with help PT Goal Formulation: With patient Time For Goal Achievement: 08/07/19 Potential to Achieve Goals: Good Progress towards PT goals: Progressing toward goals    Frequency    7X/week      PT Plan Current plan remains appropriate    Co-evaluation              AM-PAC PT "6 Clicks" Mobility   Outcome Measure  Help needed turning from your back to your side while in a flat bed without using bedrails?: A Little Help needed moving from lying on your back to sitting on the side of a flat bed without using bedrails?: A Little Help needed moving to and from a bed to a chair (  including a wheelchair)?: A Little Help needed standing up from a chair using your arms (e.g., wheelchair or bedside chair)?: A Little Help needed to walk in hospital room?: A Little Help needed climbing 3-5 steps with a railing? : A Little 6 Click Score: 18    End of Session Equipment Utilized During Treatment: Gait belt Activity Tolerance: Patient tolerated treatment well Patient left: in chair;with call bell/phone within reach;with chair alarm set Nurse Communication: Mobility status PT Visit Diagnosis: Other abnormalities of gait and mobility (R26.89);Difficulty in walking, not elsewhere classified (R26.2)     Time: [id number] PT Time Calculation (min) (ACUTE ONLY): 25 min  Charges:  $Gait Training: 23-37 mins                     Kenyon Ana, PT  Pager: [phone] Acute Rehab Dept [facility]): [phone]   08/03/2019    [facility] 08/03/2019, 12:51 PM

## 2019-08-04 NOTE — Discharge Summary (Signed)
Patient ID: Mary Lara MRN: [id number] DOB/AGE: 08/26/1923 85 y.o.  Admit date: 07/31/2019 Discharge date: 08/04/2019  Admission Diagnoses:  Principal Problem:   Unilateral primary osteoarthritis, left hip Active Problems:   Status post total replacement of left hip   Discharge Diagnoses:  Same  Past Medical History:  Diagnosis Date  . Cancer ([place])   . Ganglion cyst    R wrist  . Hypertension   . Osteoarthritis     Surgeries: Procedure(s): LEFT TOTAL HIP ARTHROPLASTY ANTERIOR APPROACH on 07/31/2019   Consultants:   Discharged Condition: Improved  Hospital Course: Allessandra CHERMAINE BATCHER is an 85 y.o. female who was admitted 07/31/2019 for operative treatment ofUnilateral primary osteoarthritis, left hip. Patient has severe unremitting pain that affects sleep, daily activities, and work/hobbies. After pre-op clearance the patient was taken to the operating room on 07/31/2019 and underwent  Procedure(s): LEFT TOTAL HIP ARTHROPLASTY ANTERIOR APPROACH.    Patient was given perioperative antibiotics:  Anti-infectives (From admission, onward)   Start     Dose/Rate Route Frequency Ordered Stop   07/31/19 1830  clindamycin (CLEOCIN) IVPB 600 mg     600 mg 100 mL/hr over 30 Minutes Intravenous Every 6 hours 07/31/19 1649 08/01/19 0141   07/31/19 0845  clindamycin (CLEOCIN) IVPB 900 mg     900 mg 100 mL/hr over 30 Minutes Intravenous On call to O.R. 07/31/19 [id number] 07/31/19 1222       Patient was given sequential compression devices, early ambulation, and chemoprophylaxis to prevent DVT.  Patient benefited maximally from hospital stay and there were no complications.    Recent vital signs:  Patient Vitals for the past 24 hrs:  BP Temp Pulse Resp SpO2  08/03/19 1316 (!) 121/56 97.8 F (36.6 C) 95 15 98 %  08/03/19 0930 133/60 - 95 14 97 %     Recent laboratory studies:  Recent Labs    08/02/19 0331  WBC 11.0*  HGB 9.6*  HCT 29.4*  PLT 170     Discharge Medications:    Allergies as of 08/03/2019      Reactions   Iodine Rash   Sulfa Antibiotics    Headaches, but not sure      Medication List    STOP taking these medications   acetaminophen 325 MG tablet Commonly known as: TYLENOL   aspirin EC 81 MG tablet Replaced by: aspirin 81 MG chewable tablet   ibuprofen 200 MG tablet Commonly known as: ADVIL     TAKE these medications   aspirin 81 MG chewable tablet Chew 1 tablet (81 mg total) by mouth 2 (two) times daily. Replaces: aspirin EC 81 MG tablet   b complex vitamins tablet Take 1 tablet by mouth daily. Notes to patient: Take tomorrow, 08/04/2019   calcium citrate-vitamin D 315-200 MG-UNIT tablet Commonly known as: CITRACAL+D Take 1 tablet by mouth 2 (two) times daily.   CertaVite Senior/Antioxidant Tabs Take 1 tablet by mouth daily.   docusate sodium 100 MG capsule Commonly known as: COLACE Take 100 mg by mouth daily as needed for mild constipation.   Fish Oil 1000 MG Caps Take 1,000 mg by mouth daily.   FISH OIL OMEGA-3 PO 1,000 mg.   furosemide 20 MG tablet Commonly known as: LASIX Take 20 mg by mouth every other day.   gabapentin 100 MG capsule Commonly known as: NEURONTIN Take 1 capsule (100 mg total) by mouth 3 (three) times daily.   HYDROcodone-acetaminophen 5-325 MG tablet Commonly known as: NORCO/VICODIN Take 1-2 tablets  by mouth every 4 (four) hours as needed for moderate pain (pain score 4-6). What changed:   how much to take  when to take this  reasons to take this   metoprolol succinate 50 MG 24 hr tablet Commonly known as: TOPROL-XL Take 50 mg by mouth daily. Notes to patient: Take tomorrow, 08/04/2019   mirtazapine 7.5 MG tablet Commonly known as: REMERON Take 3.75 mg by mouth at bedtime.   pantoprazole 40 MG tablet Commonly known as: PROTONIX Take 40 mg by mouth daily before breakfast. Notes to patient: Take tomorrow, 08/04/2019   simvastatin 20 MG tablet Commonly known as: ZOCOR Take 20  mg by mouth at bedtime.   SYSTANE OP Place 1 drop into both eyes 3 (three) times daily.   Vitamin D3 25 MCG (1000 UT) Caps Take 1,000 Units by mouth daily.       Diagnostic Studies: Dg Pelvis Portable  Result Date: 07/31/2019 CLINICAL DATA:  Status post left hip replacement EXAM: PORTABLE PELVIS 1-2 VIEWS COMPARISON:  None. FINDINGS: Left hip replacement is noted in satisfactory position. Foley catheter is noted in within the bladder. Calcification is noted in the right hemipelvis which may be related to uterine fibroid. IMPRESSION: Status post left hip replacement Electronically Signed   By: Inez Catalina M.D.   On: 07/31/2019 14:37   Dg C-arm 1-60 Min-no Report  Result Date: 07/31/2019 Fluoroscopy was utilized by the requesting physician.  No radiographic interpretation.   Dg Hip Operative Unilat W Or W/o Pelvis Left  Result Date: 07/31/2019 CLINICAL DATA:  Left total hip replacement FLUOROSCOPY TIME:  16 seconds. EXAM: OPERATIVE LEFT HIP (WITH PELVIS IF PERFORMED) 4 VIEWS TECHNIQUE: Fluoroscopic spot image(s) were submitted for interpretation post-operatively. COMPARISON:  None. FINDINGS: The left hip is replaced throughout the study. The acetabular and femoral components are in good position. IMPRESSION: Left hip replacement as above. Electronically Signed   By: Dorise Bullion III M.D   On: 07/31/2019 13:33    Disposition:     Follow-up Information    Mcarthur Rossetti, MD. Schedule an appointment as soon as possible for a visit in 2 week(s).   Specialty: Orthopedic Surgery Contact information: [place] [address] [phone]        [facility].   Why: agency will provide home health physical therapy, agency will call you to schedule first visit. Contact information: [phone]           Signed: Mcarthur Rossetti 08/04/2019, 7:22 AM

## 2019-08-05 ENCOUNTER — Telehealth: Payer: Self-pay | Admitting: Orthopaedic Surgery

## 2019-08-05 NOTE — Telephone Encounter (Signed)
Verbal order left on VM  

## 2019-08-05 NOTE — Telephone Encounter (Signed)
Received call from [facility]) with [facility] needing verbal orders for HHPT 2 Wk 1,  3 Wk 1 and 2 wk 3. Also need order for (OT)   Raquel Sarna said patient got Rx for Gabapentin filled but is not taking it because the medicine it makes her tired. The number to contact Raquel Sarna is [place] said to leave a message if she can not answer phone.

## 2019-08-10 ENCOUNTER — Telehealth: Payer: Self-pay | Admitting: Orthopaedic Surgery

## 2019-08-10 NOTE — Telephone Encounter (Signed)
Patient's daughter([name]) called and stated that she feels her mother is on 2 Hydrocodone a day. 1 in am and 1 pm. Daughter feels this is inappropriate and way too much for patients weight and age and her trying to do PT and just way too sleepy throughout entire day.Marland Kitchen Sur pressing appetite and she weak. Sleeps all the time  Stated a nurse friend of daughter told her this was way too much for her. Wanting to know if patient can take Tylenol?  Please call daughter @ [phone]

## 2019-08-10 NOTE — Telephone Encounter (Signed)
Daughter aware to have her just not take it She doesn't NEED to have it and to take only Tylenol is fine

## 2019-08-11 ENCOUNTER — Telehealth: Payer: Self-pay | Admitting: Orthopaedic Surgery

## 2019-08-11 NOTE — Telephone Encounter (Signed)
Spoke with Konrad Dolores and told him I just talked to daughter to yesterday and told her that they could quit the Hydrocodone in general. That she can take just OTC Tylenol

## 2019-08-11 NOTE — Telephone Encounter (Signed)
Tommy from [facility] called stating that the family is wanting to know if Dr. Ninfa Linden would reduce the dosage of her Hydrocodone due it making the patient more out of it when she takes it.  If Dr. Ninfa Linden can not reduce the dosage, they are wanting to know if he can prescribe something else.  Patient is not experiencing any pain right now.  Tommy's CB#[phone].  Thank you.

## 2019-08-12 ENCOUNTER — Telehealth: Payer: Self-pay | Admitting: Orthopaedic Surgery

## 2019-08-12 NOTE — Telephone Encounter (Signed)
Nancy/[facility]/OT called needing verbal order  1x week 1  2x week 1  1x week 1  Please call Izora Gala @ [phone]

## 2019-08-12 NOTE — Telephone Encounter (Signed)
Verbal order left on VM  

## 2019-08-13 ENCOUNTER — Ambulatory Visit (INDEPENDENT_AMBULATORY_CARE_PROVIDER_SITE_OTHER): Payer: Medicare HMO | Admitting: Orthopaedic Surgery

## 2019-08-13 ENCOUNTER — Encounter: Payer: Self-pay | Admitting: Orthopaedic Surgery

## 2019-08-13 DIAGNOSIS — Z96642 Presence of left artificial hip joint: Secondary | ICD-10-CM

## 2019-08-13 NOTE — Progress Notes (Signed)
The patient is a 85 year old female who is 2 weeks tomorrow status post a left total hip arthroplasty.  She has been very pleased with that hip surgery in terms of her mobility and how this is diminished her pain.  Her daughter is with her today.  The patient has been on hydrocodone for around a year and now that she is not needed to take that she has developed some diarrhea.  She has been taking only Tylenol for pain.  Her daughter is concerned about her lack of appetite as well as some type of rash or dermatitis she has had on her backside.  She has been putting some type of powder that was recommended by dermatologist.  She is stop wearing the mesh underwear that she had on there may have been causing a contact dermatitis as well.  Her left hip incision is assessed and the staples removed and Steri-Strips applied.  It looks good overall.  With no evidence of infection.  I did encourage the use of supplements such as Ensure or boost and talk to the patient at length about the importance of diet and protein intake as well as increasing her caloric intake.  I have recommended that she potentially stop putting any type of powder on her back side and tried potentially even just hydrocortisone cream more just leaving it dry.  She can sleep on her side from my standpoint.  All question concerns were answered and addressed.  We will see her back in 4 weeks to see how she is doing overall.  If there is any issues before then they will let us know.

## 2019-09-09 ENCOUNTER — Ambulatory Visit: Payer: Medicare HMO | Admitting: Orthopaedic Surgery

## 2019-09-09 ENCOUNTER — Encounter: Payer: Self-pay | Admitting: Physician Assistant

## 2019-09-09 ENCOUNTER — Ambulatory Visit (INDEPENDENT_AMBULATORY_CARE_PROVIDER_SITE_OTHER): Payer: Medicare HMO | Admitting: Physician Assistant

## 2019-09-09 DIAGNOSIS — M1711 Unilateral primary osteoarthritis, right knee: Secondary | ICD-10-CM

## 2019-09-09 DIAGNOSIS — Z96642 Presence of left artificial hip joint: Secondary | ICD-10-CM

## 2019-09-09 MED ORDER — LIDOCAINE HCL 1 % IJ SOLN
5.0000 mL | INTRAMUSCULAR | Status: AC | PRN
  Administered 2019-09-09: 5 mL

## 2019-09-09 MED ORDER — METHYLPREDNISOLONE ACETATE 40 MG/ML IJ SUSP
40.0000 mg | INTRAMUSCULAR | Status: AC | PRN
  Administered 2019-09-09: 40 mg via INTRA_ARTICULAR

## 2019-09-09 NOTE — Progress Notes (Signed)
Office Visit Note   Patient: Mary Lara           Date of Birth: Jan 11, 1923           MRN: [id number] Visit Date: 09/09/2019              Requested by: Tracie Harrier, MD [address] [facility] [place],  [place] [address] PCP: Tracie Harrier, MD   Assessment & Plan: Visit Diagnoses:  1. Status post total replacement of left hip     Plan:  We will have her follow-up with [place] in 3 months sooner if she has any questions concerns regarding the left hip.  In regards to the right knee she does not want any type of surgical intervention.  She understands she needs to wait least 3 months between injections.  Questions encouraged and answered both patient and her daughters present throughout examination today.  Follow-Up Instructions: No follow-ups on file.   Orders:  No orders of the defined types were placed in this encounter.  No orders of the defined types were placed in this encounter.     Procedures: Large Joint Inj: R knee on 09/09/2019 3:06 PM Indications: pain Details: 22 G 1.5 in needle, anterolateral approach  Arthrogram: No  Medications: 40 mg methylPREDNISolone acetate 40 MG/ML; 5 mL lidocaine 1 % Aspirate: 25 mL yellow Outcome: tolerated well, no immediate complications Procedure, treatment alternatives, risks and benefits explained, specific risks discussed. Consent was given by the patient. Immediately prior to procedure a time out was called to verify the correct patient, procedure, equipment, support staff and site/side marked as required. Patient was prepped and draped in the usual sterile fashion.       Clinical Data: No additional findings.   Subjective: Chief Complaint  Patient presents with   Left Hip - Follow-up   Right Knee - Pain    HPI Ms. Mary Lara returns today 40 days status post left total hip arthroplasty.  She is overall doing well from the left hip standpoint.  She is mainly complaining of right knee pain and  swelling.  She has bone-on-bone arthritis involving the lateral compartment with arthritic changes noted in the other 2 compartments.  She notes that she has had some swelling of the right knee.  She had no new injury. Review of Systems Negative for fevers chills.  Objective: Vital Signs: There were no vitals taken for this visit.  Physical Exam General: Well-developed well-nourished female no acute distress Ortho Exam Left hip good range of motion without pain surgical incisions healing well. Right knee obvious valgus deformity.  She lacks full extension the knee by about 7 to 10 degrees.  No instability valgus varus stressing.  No significant tenderness along medial lateral joint line. Specialty Comments:  No specialty comments available.  Imaging: No results found.   PMFS History: Patient Active Problem List   Diagnosis Date Noted   Status post total replacement of left hip 07/31/2019   Unilateral primary osteoarthritis, left hip 02/18/2019   Past Medical History:  Diagnosis Date   Cancer ([place])    Ganglion cyst    R wrist   Hypertension    Osteoarthritis     History reviewed. No pertinent family history.  Past Surgical History:  Procedure Laterality Date   ABDOMINAL HYSTERECTOMY     APPENDECTOMY     BREAST BIOPSY     SKIN CANCER EXCISION     TOTAL HIP ARTHROPLASTY Left 07/31/2019   Procedure: LEFT TOTAL  HIP ARTHROPLASTY ANTERIOR APPROACH;  Surgeon: Mcarthur Rossetti, MD;  Location: [facility];  Service: Orthopedics;  Laterality: Left;   Social History   Occupational History   Not on file  Tobacco Use   Smoking status: Never Smoker   Smokeless tobacco: Never Used  Substance and Sexual Activity   Alcohol use: Never    Frequency: Never   Drug use: Never   Sexual activity: Not Currently

## 2019-12-08 ENCOUNTER — Ambulatory Visit: Payer: Medicare HMO | Admitting: Orthopaedic Surgery

## 2019-12-18 ENCOUNTER — Telehealth: Payer: Self-pay | Admitting: Orthopaedic Surgery

## 2019-12-18 NOTE — Telephone Encounter (Signed)
Patient's daughter called. Patient has no upcoming appointments but she wanted to notify Dr. Ninfa Linden that her mother is having a COVID test done. She feels it should be ran by her doctor as a formality in case he might advise against it.   Call back number: [phone]

## 2019-12-21 NOTE — Telephone Encounter (Signed)
Patient daughter aware this is ok

## 2020-01-14 IMAGING — MR MR LUMBAR SPINE W/O CM
4 of 5 series · 18 of 48 positions shown · non-contrast
Comparison: Radiography 10/28/2018.  CT 11/15/2016.

CLINICAL DATA: Low back pain. Left hip leg and foot pain over the
last 18 months.

EXAM:
MRI LUMBAR SPINE WITHOUT CONTRAST
TECHNIQUE: Multiplanar, multisequence MR imaging of the lumbar spine was
performed. No intravenous contrast was administered.

[Series 5: T2 · sagittal · 4.0mm · 0.73mm/px · 6 of 18 slices shown (1 of 2)]
[im 1/18]
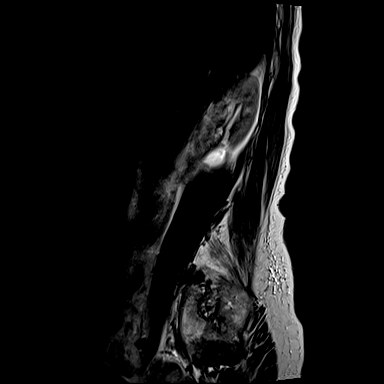
[im 4/18]
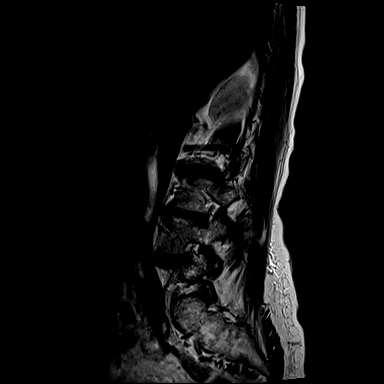
[im 7/18]
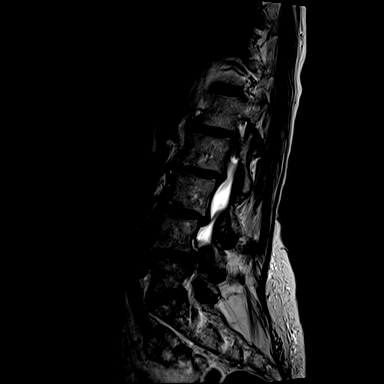
[im 11/18]
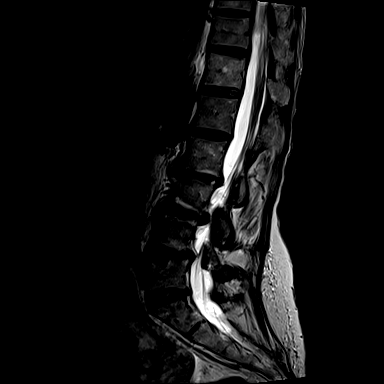
[im 14/18]
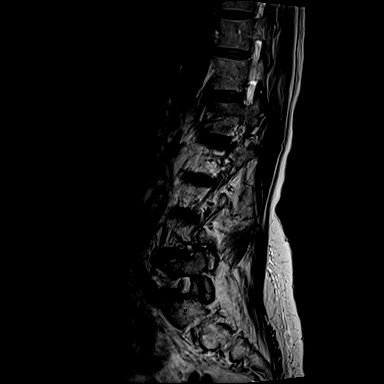
[im 18/18]
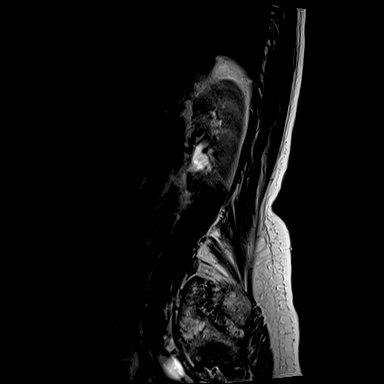

[Series 7: T1 · sagittal · 4.0mm · 0.73mm/px · 3 of 18 slices shown (1 of 2)]
[im 3/18]
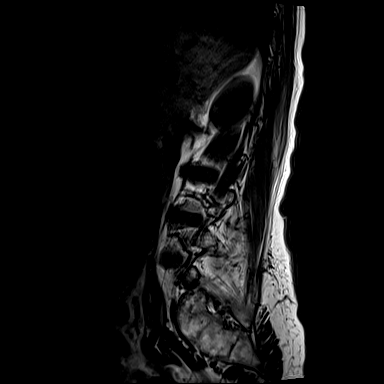
[im 9/18]
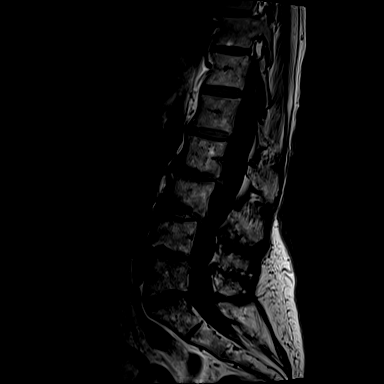
[im 15/18]
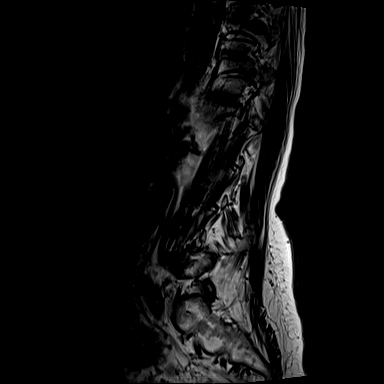

[Series 10: T1 · axial · 4.0mm · 0.28mm/px · z∈[-56,+80]mm · 3 of 39 slices shown (2 of 2)]
[im 6/39]
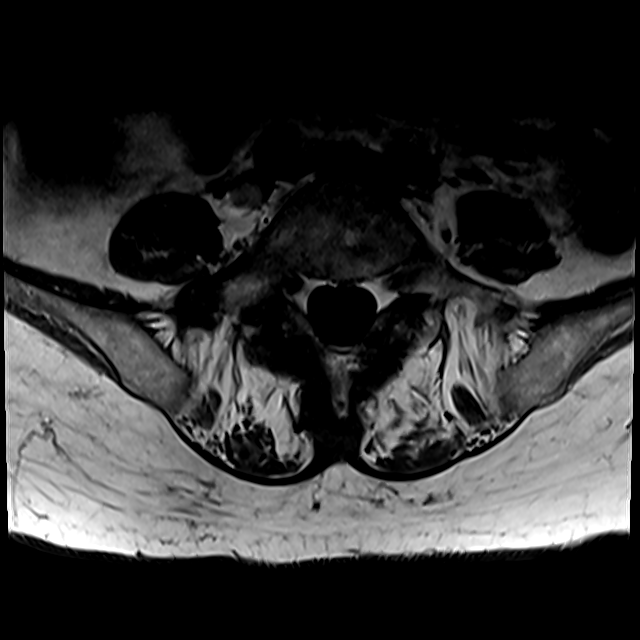
[im 21/39]
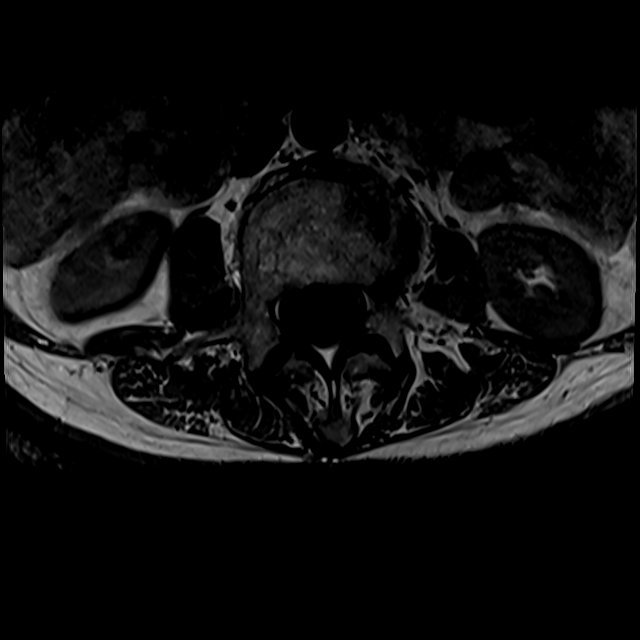
[im 33/39]
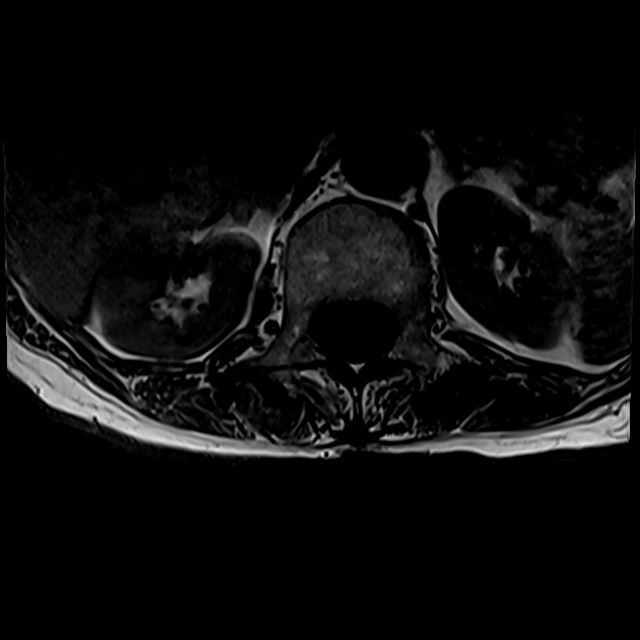

[Series 13: T2 · axial · 4.0mm · 0.28mm/px · z∈[-81,+80]mm · 6 of 39 slices shown (2 of 2)]
[im 1/39]
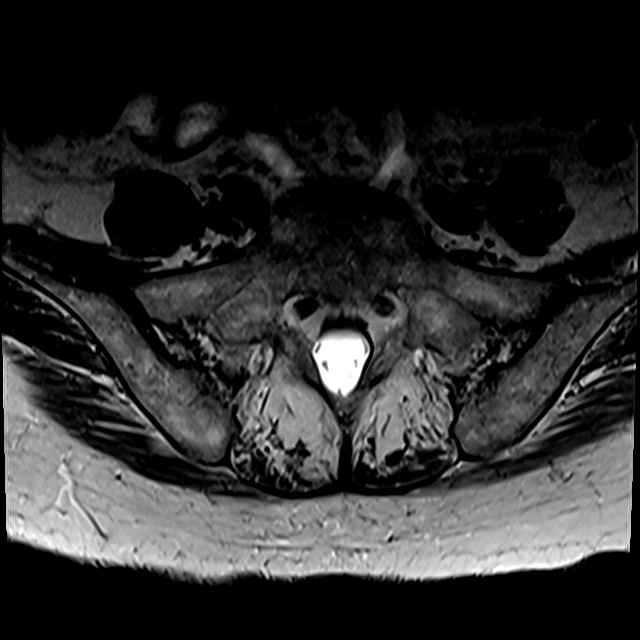
[im 6/39]
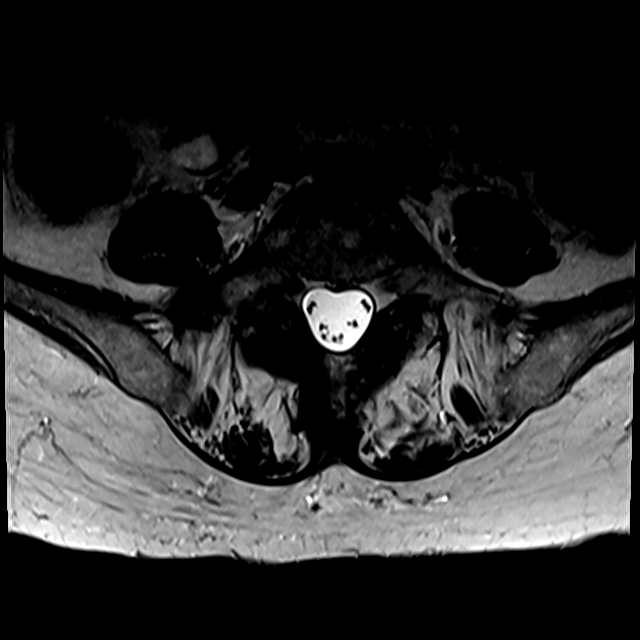
[im 12/39]
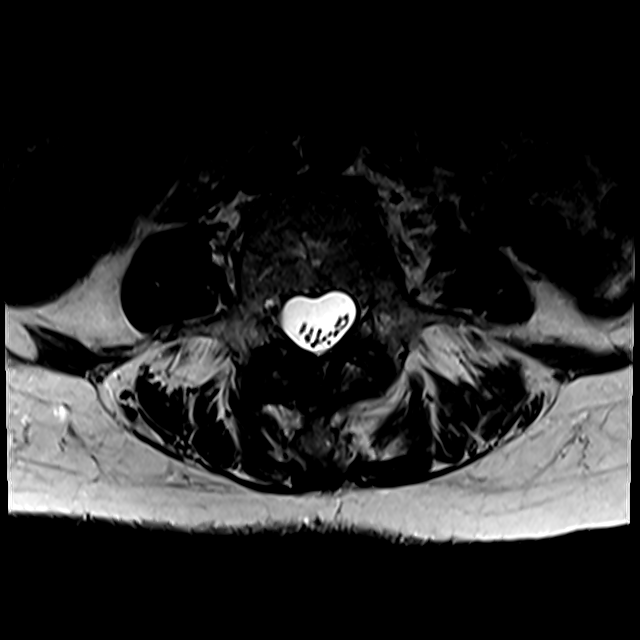
[im 18/39]
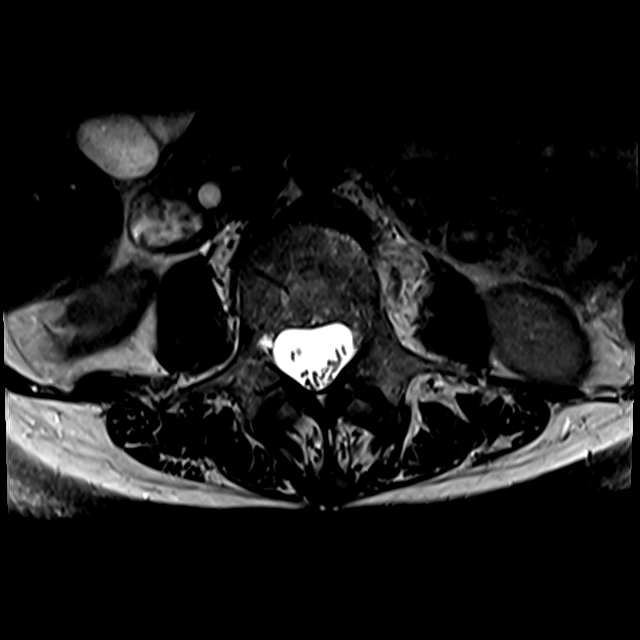
[im 21/39]
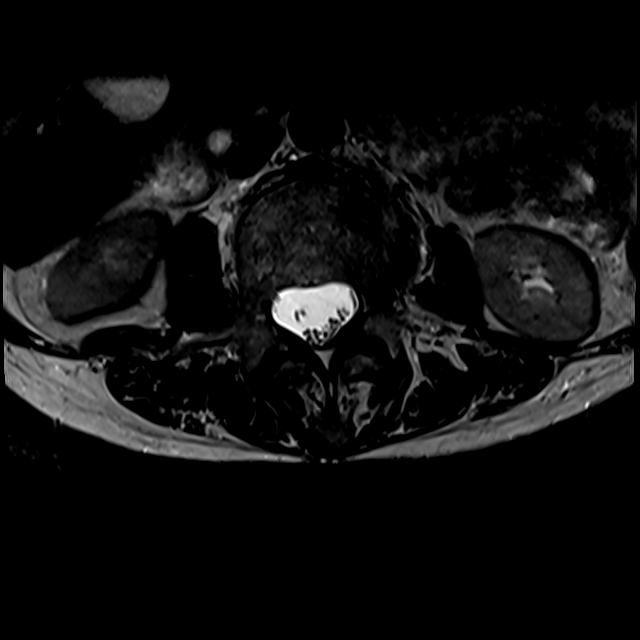
[im 33/39]
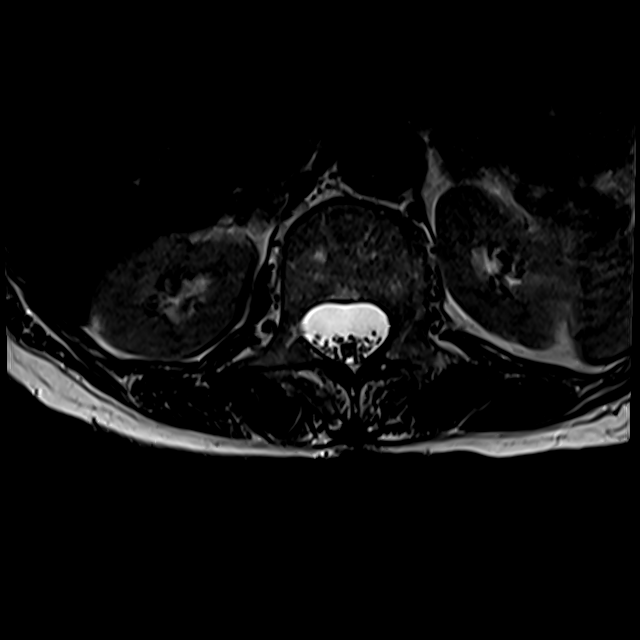

[18 of 48 positions shown; findings below may reference images not displayed]

FINDINGS: Segmentation:  5 lumbar type vertebral bodies.

Alignment: Curvature convex to the right with the apex at L3. 3 mm
retrolisthesis L3-4.

Vertebrae: No fracture or primary bone lesion. Discogenic endplate
changes on the left at L2-3 and L3-4.

Conus medullaris and cauda equina: Conus extends to the L1 level.
Conus and cauda equina appear normal.

Paraspinal and other soft tissues: Negative

Disc levels:

No significant finding at L1-2 or above.

L2-3: Bulging of the disc. Mild facet and ligamentous hypertrophy.
Mild narrowing of the left lateral recess but no compressive
stenosis.

L3-4: Endplate osteophytes and bulging of the disc. Retrolisthesis
of 3 mm. Mild facet and ligamentous hypertrophy. Stenosis of the
lateral recesses left more than right. Foraminal narrowing on the
left. Some potential for left-sided neural compression in the
lateral recess and foramen.

L4-5: Endplate osteophytes and shallow disc protrusion more
prominent towards the right. Facet and ligamentous hypertrophy.
Lateral recess stenosis on the right that could cause neural
compression. Mild bilateral foraminal narrowing.

L5-S1: Noncompressive disc bulge. Canal and foramina sufficiently
patent.
IMPRESSION: Curvature convex to the right with the apex at L3.

Discogenic edematous changes on the left at L2-3 and L3-4 which
could be associated with pain.

L3-4: Bilateral lateral recess and foraminal narrowing left worse
than right could cause neural compression, particularly on the left.

L4-5: Lateral recess stenosis right more than left could cause
right-sided neural compression. Mild bilateral foraminal narrowing.

## 2020-04-27 ENCOUNTER — Ambulatory Visit (INDEPENDENT_AMBULATORY_CARE_PROVIDER_SITE_OTHER): Payer: Medicare HMO

## 2020-04-27 ENCOUNTER — Other Ambulatory Visit: Payer: Self-pay

## 2020-04-27 ENCOUNTER — Ambulatory Visit (INDEPENDENT_AMBULATORY_CARE_PROVIDER_SITE_OTHER): Payer: Medicare HMO | Admitting: Orthopaedic Surgery

## 2020-04-27 ENCOUNTER — Encounter: Payer: Self-pay | Admitting: Orthopaedic Surgery

## 2020-04-27 DIAGNOSIS — M25561 Pain in right knee: Secondary | ICD-10-CM | POA: Diagnosis not present

## 2020-04-27 DIAGNOSIS — G8929 Other chronic pain: Secondary | ICD-10-CM

## 2020-04-27 DIAGNOSIS — Z96642 Presence of left artificial hip joint: Secondary | ICD-10-CM

## 2020-04-27 MED ORDER — LIDOCAINE HCL 1 % IJ SOLN
3.0000 mL | INTRAMUSCULAR | Status: AC | PRN
  Administered 2020-04-27: 3 mL

## 2020-04-27 MED ORDER — METHYLPREDNISOLONE ACETATE 40 MG/ML IJ SUSP
40.0000 mg | INTRAMUSCULAR | Status: AC | PRN
  Administered 2020-04-27: 40 mg via INTRA_ARTICULAR

## 2020-04-27 NOTE — Progress Notes (Signed)
Office Visit Note   Patient: Mary Lara           Date of Birth: 04-11-1923           MRN: [id number] Visit Date: 04/27/2020              Requested by: Tracie Harrier, MD [address] [facility] [place],  [place] [address] PCP: Tracie Harrier, MD   Assessment & Plan: Visit Diagnoses:  1. Status post total replacement of left hip   2. Chronic pain of right knee     Plan: I was able to aspirate about 40 to 50 cc of fluid off of her right knee and placed a steroid injection in it.  The only other treatment option of the knee replacement which she wants to avoid since she is 86 years old.  She can continue to ambulate with a walker.  Follow-up at this point for her hip and her knee can be as needed.  If things worsen they will let us know.  Follow-Up Instructions: Return in about 4 weeks (around 05/25/2020).   Orders:  Orders Placed This Encounter  Procedures  . Large Joint Inj: R knee  . XR HIP UNILAT W OR W/O PELVIS 1V LEFT  . XR Knee 1-2 Views Right   No orders of the defined types were placed in this encounter.     Procedures: Large Joint Inj: R knee on 04/27/2020 12:59 PM Indications: diagnostic evaluation and pain Details: 22 G 1.5 in needle, superolateral approach  Arthrogram: No  Medications: 3 mL lidocaine 1 %; 40 mg methylPREDNISolone acetate 40 MG/ML Outcome: tolerated well, no immediate complications Procedure, treatment alternatives, risks and benefits explained, specific risks discussed. Consent was given by the patient. Immediately prior to procedure a time out was called to verify the correct patient, procedure, equipment, support staff and site/side marked as required. Patient was prepped and draped in the usual sterile fashion.       Clinical Data: No additional findings.   Subjective: Chief Complaint  Patient presents with  . Left Hip - Follow-up  . Right Knee - Pain  The patient is a 86 year old female that we replaced  her left hip in August last year due to severe hip arthritis.  She has been doing well with that hip.  She has been having right knee pain.  Back in September of last year we did aspirate her knee and placed a steroid injection in it today.  She feels like it is getting more swollen and painful and would like to have that looked at today.  We will also take a look at her left hip today.  She has no complaints as relates to the left hip.  She has had no other acute change in her medical status.  Her main complaint is right knee pain.  HPI  Review of Systems She currently denies any headache, chest pain, shortness of breath, fever, chills, nausea, vomiting  Objective: Vital Signs: There were no vitals taken for this visit.  Physical Exam She is alert and orient x3 and in no acute distress Ortho Exam Examination of Specialty Comments:  No specialty comments available.  Imaging: XR HIP UNILAT W OR W/O PELVIS 1V LEFT  Result Date: 04/27/2020 An AP pelvis and lateral left hip shows a well-seated total hip arthroplasty with no complicating features.  XR Knee 1-2 Views Right  Result Date: 04/27/2020 2 views of the right knee show tricompartmental arthritic changes with  valgus malalignment.  The medial joint line and lateral joint line are significantly narrowed.    PMFS History: Patient Active Problem List   Diagnosis Date Noted  . Status post total replacement of left hip 07/31/2019  . Unilateral primary osteoarthritis, left hip 02/18/2019   Past Medical History:  Diagnosis Date  . Cancer ([place])   . Ganglion cyst    R wrist  . Hypertension   . Osteoarthritis     No family history on file.  Past Surgical History:  Procedure Laterality Date  . ABDOMINAL HYSTERECTOMY    . APPENDECTOMY    . BREAST BIOPSY    . SKIN CANCER EXCISION    . TOTAL HIP ARTHROPLASTY Left 07/31/2019   Procedure: LEFT TOTAL HIP ARTHROPLASTY ANTERIOR APPROACH;  Surgeon: Mcarthur Rossetti, MD;   Location: [facility];  Service: Orthopedics;  Laterality: Left;   Social History   Occupational History  . Not on file  Tobacco Use  . Smoking status: Never Smoker  . Smokeless tobacco: Never Used  Substance and Sexual Activity  . Alcohol use: Never  . Drug use: Never  . Sexual activity: Not Currently

## 2020-05-18 ENCOUNTER — Other Ambulatory Visit: Payer: Self-pay | Admitting: Internal Medicine

## 2020-05-18 DIAGNOSIS — Z9181 History of falling: Secondary | ICD-10-CM

## 2020-05-18 DIAGNOSIS — R519 Headache, unspecified: Secondary | ICD-10-CM

## 2020-06-02 ENCOUNTER — Ambulatory Visit
Admission: RE | Admit: 2020-06-02 | Discharge: 2020-06-02 | Disposition: A | Discharge status: Home / Self Care | Payer: Medicare HMO | Source: Ambulatory Visit | Attending: Internal Medicine | Admitting: Internal Medicine

## 2020-06-02 ENCOUNTER — Other Ambulatory Visit: Payer: Self-pay

## 2020-06-02 DIAGNOSIS — R519 Headache, unspecified: Secondary | ICD-10-CM | POA: Diagnosis present

## 2020-06-02 DIAGNOSIS — Z9181 History of falling: Secondary | ICD-10-CM | POA: Diagnosis present

## 2020-10-03 IMAGING — DX PORTABLE PELVIS 1-2 VIEWS
1 series · 1 of 1 positions shown · non-contrast
Comparison: None.

CLINICAL DATA: Status post left hip replacement

EXAM:
PORTABLE PELVIS 1-2 VIEWS

[pelvis ap]
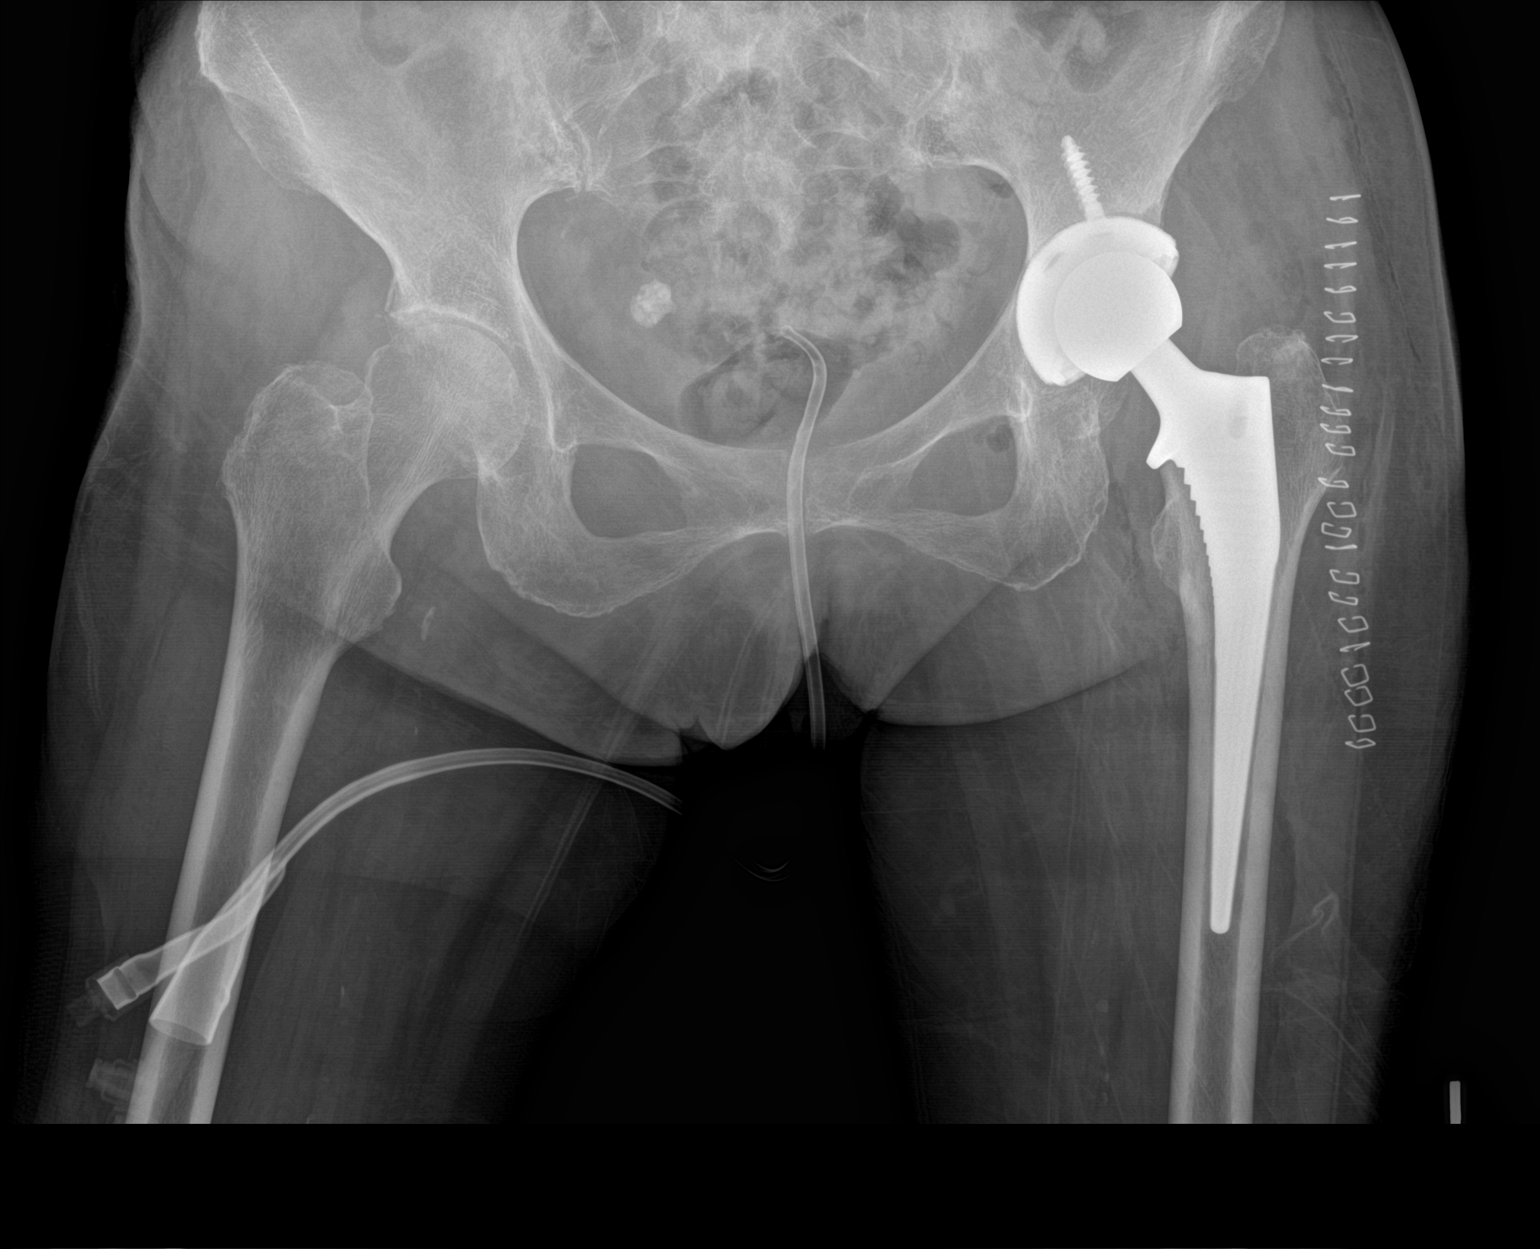

[1 of 1 positions shown; findings below may reference images not displayed]

FINDINGS: Left hip replacement is noted in satisfactory position. Foley
catheter is noted in within the bladder. Calcification is noted in
the right hemipelvis which may be related to uterine fibroid.
IMPRESSION: Status post left hip replacement

## 2021-01-31 ENCOUNTER — Other Ambulatory Visit: Payer: Self-pay | Admitting: Internal Medicine

## 2021-01-31 ENCOUNTER — Ambulatory Visit
Admission: RE | Admit: 2021-01-31 | Discharge: 2021-01-31 | Disposition: A | Discharge status: Home / Self Care | Payer: Medicare HMO | Source: Ambulatory Visit | Attending: Internal Medicine | Admitting: Internal Medicine

## 2021-01-31 ENCOUNTER — Other Ambulatory Visit: Payer: Self-pay

## 2021-01-31 DIAGNOSIS — M7989 Other specified soft tissue disorders: Secondary | ICD-10-CM | POA: Insufficient documentation

## 2021-03-01 ENCOUNTER — Telehealth: Payer: Self-pay

## 2021-03-01 NOTE — Telephone Encounter (Signed)
I called pt and advised that this is no longer needed. Daughter stated understanding and was advised to have dental office reach out to [place].

## 2021-03-01 NOTE — Telephone Encounter (Signed)
Patients daughter called she stated the patient is having a dental procedure Monday 28,2022 and she needs a antibiotic prescribed, she is requesting the antibiotic to be sent to [facility] in [place]  [name] would like a call back when rx has been sent call back:[phone]

## 2022-07-26 ENCOUNTER — Other Ambulatory Visit: Payer: Self-pay | Admitting: Family Medicine

## 2022-07-26 ENCOUNTER — Ambulatory Visit
Admission: RE | Admit: 2022-07-26 | Discharge: 2022-07-26 | Disposition: A | Discharge status: Home / Self Care | Payer: Medicare HMO | Source: Ambulatory Visit | Attending: Family Medicine | Admitting: Family Medicine

## 2022-07-26 DIAGNOSIS — S40021A Contusion of right upper arm, initial encounter: Secondary | ICD-10-CM | POA: Insufficient documentation

## 2022-07-26 DIAGNOSIS — M7989 Other specified soft tissue disorders: Secondary | ICD-10-CM

## 2022-07-26 DIAGNOSIS — M79631 Pain in right forearm: Secondary | ICD-10-CM | POA: Insufficient documentation
